# Patient Record
Sex: Female | Born: 1955 | Race: White | Hispanic: No | Marital: Married | Smoking: Never smoker
Health system: Southern US, Community
[De-identification: ages and names within clinical notes are randomized; demographics above are authoritative.]

## PROBLEM LIST (undated history)

## (undated) DIAGNOSIS — R112 Nausea with vomiting, unspecified: Secondary | ICD-10-CM

## (undated) DIAGNOSIS — H3530 Unspecified macular degeneration: Secondary | ICD-10-CM

## (undated) DIAGNOSIS — T7840XA Allergy, unspecified, initial encounter: Secondary | ICD-10-CM

## (undated) DIAGNOSIS — D649 Anemia, unspecified: Secondary | ICD-10-CM

## (undated) DIAGNOSIS — K5790 Diverticulosis of intestine, part unspecified, without perforation or abscess without bleeding: Secondary | ICD-10-CM

## (undated) DIAGNOSIS — B372 Candidiasis of skin and nail: Secondary | ICD-10-CM

## (undated) DIAGNOSIS — K5900 Constipation, unspecified: Secondary | ICD-10-CM

## (undated) DIAGNOSIS — M542 Cervicalgia: Secondary | ICD-10-CM

## (undated) DIAGNOSIS — L719 Rosacea, unspecified: Secondary | ICD-10-CM

## (undated) DIAGNOSIS — IMO0001 Reserved for inherently not codable concepts without codable children: Secondary | ICD-10-CM

## (undated) DIAGNOSIS — M1990 Unspecified osteoarthritis, unspecified site: Secondary | ICD-10-CM

## (undated) DIAGNOSIS — M26609 Unspecified temporomandibular joint disorder, unspecified side: Secondary | ICD-10-CM

## (undated) DIAGNOSIS — Z86010 Personal history of colonic polyps: Secondary | ICD-10-CM

## (undated) DIAGNOSIS — Z9889 Other specified postprocedural states: Secondary | ICD-10-CM

## (undated) DIAGNOSIS — F419 Anxiety disorder, unspecified: Secondary | ICD-10-CM

## (undated) DIAGNOSIS — M8580 Other specified disorders of bone density and structure, unspecified site: Secondary | ICD-10-CM

## (undated) DIAGNOSIS — R51 Headache: Secondary | ICD-10-CM

## (undated) DIAGNOSIS — K219 Gastro-esophageal reflux disease without esophagitis: Secondary | ICD-10-CM

## (undated) DIAGNOSIS — J321 Chronic frontal sinusitis: Secondary | ICD-10-CM

## (undated) DIAGNOSIS — G8929 Other chronic pain: Secondary | ICD-10-CM

## (undated) DIAGNOSIS — K921 Melena: Secondary | ICD-10-CM

## (undated) HISTORY — DX: Other specified disorders of bone density and structure, unspecified site: M85.80

## (undated) HISTORY — DX: Constipation, unspecified: K59.00

## (undated) HISTORY — DX: Headache: R51

## (undated) HISTORY — PX: POLYPECTOMY: SHX149

## (undated) HISTORY — PX: TONSILLECTOMY: SUR1361

## (undated) HISTORY — DX: Anemia, unspecified: D64.9

## (undated) HISTORY — DX: Unspecified osteoarthritis, unspecified site: M19.90

## (undated) HISTORY — DX: Candidiasis of skin and nail: B37.2

## (undated) HISTORY — PX: ABDOMINAL HYSTERECTOMY: SHX81

## (undated) HISTORY — DX: Unspecified macular degeneration: H35.30

## (undated) HISTORY — PX: COLONOSCOPY: SHX174

## (undated) HISTORY — DX: Rosacea, unspecified: L71.9

## (undated) HISTORY — DX: Other chronic pain: G89.29

## (undated) HISTORY — DX: Personal history of colonic polyps: Z86.010

## (undated) HISTORY — DX: Reserved for inherently not codable concepts without codable children: IMO0001

## (undated) HISTORY — PX: APPENDECTOMY: SHX54

## (undated) HISTORY — DX: Unspecified temporomandibular joint disorder, unspecified side: M26.609

## (undated) HISTORY — DX: Melena: K92.1

## (undated) HISTORY — DX: Gastro-esophageal reflux disease without esophagitis: K21.9

## (undated) HISTORY — DX: Allergy, unspecified, initial encounter: T78.40XA

## (undated) HISTORY — DX: Anxiety disorder, unspecified: F41.9

## (undated) HISTORY — DX: Chronic frontal sinusitis: J32.1

## (undated) HISTORY — DX: Cervicalgia: M54.2

---

## 1998-03-07 ENCOUNTER — Encounter: Payer: Self-pay | Admitting: Gastroenterology

## 1998-05-27 ENCOUNTER — Other Ambulatory Visit: Admission: RE | Admit: 1998-05-27 | Discharge: 1998-05-27 | Payer: Self-pay | Admitting: Internal Medicine

## 1999-06-21 ENCOUNTER — Encounter: Payer: Self-pay | Admitting: Internal Medicine

## 1999-06-21 ENCOUNTER — Encounter: Admission: RE | Admit: 1999-06-21 | Discharge: 1999-06-21 | Payer: Self-pay | Admitting: Internal Medicine

## 2000-02-06 ENCOUNTER — Encounter: Payer: Self-pay | Admitting: Internal Medicine

## 2000-02-06 ENCOUNTER — Encounter: Admission: RE | Admit: 2000-02-06 | Discharge: 2000-02-06 | Payer: Self-pay | Admitting: Internal Medicine

## 2001-02-10 ENCOUNTER — Encounter: Payer: Self-pay | Admitting: Internal Medicine

## 2001-02-10 ENCOUNTER — Encounter: Admission: RE | Admit: 2001-02-10 | Discharge: 2001-02-10 | Payer: Self-pay | Admitting: Internal Medicine

## 2002-02-12 ENCOUNTER — Encounter: Payer: Self-pay | Admitting: Internal Medicine

## 2002-02-12 ENCOUNTER — Encounter: Admission: RE | Admit: 2002-02-12 | Discharge: 2002-02-12 | Payer: Self-pay | Admitting: Internal Medicine

## 2002-03-25 ENCOUNTER — Encounter: Payer: Self-pay | Admitting: Internal Medicine

## 2002-03-25 ENCOUNTER — Encounter: Admission: RE | Admit: 2002-03-25 | Discharge: 2002-03-25 | Payer: Self-pay | Admitting: Internal Medicine

## 2002-12-04 ENCOUNTER — Encounter: Admission: RE | Admit: 2002-12-04 | Discharge: 2002-12-04 | Payer: Self-pay | Admitting: Internal Medicine

## 2003-02-11 ENCOUNTER — Encounter: Admission: RE | Admit: 2003-02-11 | Discharge: 2003-02-11 | Payer: Self-pay | Admitting: Internal Medicine

## 2004-02-01 ENCOUNTER — Encounter: Admission: RE | Admit: 2004-02-01 | Discharge: 2004-02-01 | Payer: Self-pay | Admitting: Internal Medicine

## 2005-01-08 ENCOUNTER — Ambulatory Visit: Payer: Self-pay | Admitting: Internal Medicine

## 2005-02-13 ENCOUNTER — Ambulatory Visit: Payer: Self-pay | Admitting: Internal Medicine

## 2005-02-22 ENCOUNTER — Encounter: Admission: RE | Admit: 2005-02-22 | Discharge: 2005-02-22 | Payer: Self-pay | Admitting: Internal Medicine

## 2005-10-24 ENCOUNTER — Ambulatory Visit: Payer: Self-pay | Admitting: Gastroenterology

## 2005-10-25 ENCOUNTER — Encounter: Payer: Self-pay | Admitting: Internal Medicine

## 2005-10-25 ENCOUNTER — Ambulatory Visit: Payer: Self-pay | Admitting: Gastroenterology

## 2005-10-25 ENCOUNTER — Encounter (INDEPENDENT_AMBULATORY_CARE_PROVIDER_SITE_OTHER): Payer: Self-pay | Admitting: Specialist

## 2006-03-05 ENCOUNTER — Encounter: Admission: RE | Admit: 2006-03-05 | Discharge: 2006-03-05 | Payer: Self-pay | Admitting: *Deleted

## 2006-03-19 ENCOUNTER — Encounter: Admission: RE | Admit: 2006-03-19 | Discharge: 2006-03-19 | Payer: Self-pay | Admitting: *Deleted

## 2007-01-06 ENCOUNTER — Ambulatory Visit: Payer: Self-pay | Admitting: Internal Medicine

## 2007-01-06 LAB — CONVERTED CEMR LAB
ALT: 19 units/L (ref 0–35)
AST: 22 units/L (ref 0–37)
Albumin: 3.9 g/dL (ref 3.5–5.2)
Alkaline Phosphatase: 53 units/L (ref 39–117)
BUN: 8 mg/dL (ref 6–23)
Basophils Absolute: 0 10*3/uL (ref 0.0–0.1)
Basophils Relative: 0.1 % (ref 0.0–1.0)
Bilirubin Urine: NEGATIVE
Bilirubin, Direct: 0.1 mg/dL (ref 0.0–0.3)
Blood in Urine, dipstick: NEGATIVE
CO2: 29 meq/L (ref 19–32)
Calcium: 9.2 mg/dL (ref 8.4–10.5)
Chloride: 105 meq/L (ref 96–112)
Cholesterol: 199 mg/dL (ref 0–200)
Creatinine, Ser: 0.7 mg/dL (ref 0.4–1.2)
Eosinophils Absolute: 0.1 10*3/uL (ref 0.0–0.6)
Eosinophils Relative: 1.9 % (ref 0.0–5.0)
GFR calc Af Amer: 113 mL/min
GFR calc non Af Amer: 93 mL/min
Glucose, Bld: 85 mg/dL (ref 70–99)
Glucose, Urine, Semiquant: NEGATIVE
HCT: 40.8 % (ref 36.0–46.0)
HDL: 72.7 mg/dL (ref >39.0)
Hemoglobin: 14 g/dL (ref 12.0–15.0)
Ketones, urine, test strip: NEGATIVE
LDL Cholesterol: 117 mg/dL — ABNORMAL HIGH (ref 0–99)
Lymphocytes Relative: 38.8 % (ref 12.0–46.0)
MCHC: 34.3 g/dL (ref 30.0–36.0)
MCV: 95.5 fL (ref 78.0–100.0)
Monocytes Absolute: 0.4 10*3/uL (ref 0.2–0.7)
Monocytes Relative: 11 % (ref 3.0–11.0)
Neutro Abs: 1.5 10*3/uL (ref 1.4–7.7)
Neutrophils Relative %: 48.2 % (ref 43.0–77.0)
Nitrite: NEGATIVE
Platelets: 183 10*3/uL (ref 150–400)
Potassium: 4.3 meq/L (ref 3.5–5.1)
Protein, U semiquant: NEGATIVE
RBC: 4.27 M/uL (ref 3.87–5.11)
RDW: 11.8 % (ref 11.5–14.6)
Sodium: 140 meq/L (ref 135–145)
Specific Gravity, Urine: 1.02
TSH: 1.08 microintl units/mL (ref 0.35–5.50)
Total Bilirubin: 0.6 mg/dL (ref 0.3–1.2)
Total CHOL/HDL Ratio: 2.7
Total Protein: 6.6 g/dL (ref 6.0–8.3)
Triglycerides: 48 mg/dL (ref 0–149)
Urobilinogen, UA: 0.2
VLDL: 10 mg/dL (ref 0–40)
WBC Urine, dipstick: NEGATIVE
WBC: 3.2 10*3/uL — ABNORMAL LOW (ref 4.5–10.5)
pH: 5.5

## 2007-01-28 ENCOUNTER — Ambulatory Visit: Payer: Self-pay | Admitting: Internal Medicine

## 2007-01-28 DIAGNOSIS — B372 Candidiasis of skin and nail: Secondary | ICD-10-CM

## 2007-01-28 DIAGNOSIS — J321 Chronic frontal sinusitis: Secondary | ICD-10-CM

## 2007-01-28 HISTORY — DX: Candidiasis of skin and nail: B37.2

## 2007-01-28 HISTORY — DX: Chronic frontal sinusitis: J32.1

## 2007-01-31 ENCOUNTER — Ambulatory Visit: Payer: Self-pay | Admitting: Cardiology

## 2007-02-07 ENCOUNTER — Telehealth: Payer: Self-pay | Admitting: *Deleted

## 2007-02-27 ENCOUNTER — Telehealth (INDEPENDENT_AMBULATORY_CARE_PROVIDER_SITE_OTHER): Payer: Self-pay | Admitting: *Deleted

## 2007-03-07 ENCOUNTER — Encounter: Admission: RE | Admit: 2007-03-07 | Discharge: 2007-03-07 | Payer: Self-pay | Admitting: Obstetrics and Gynecology

## 2008-03-09 ENCOUNTER — Encounter: Admission: RE | Admit: 2008-03-09 | Discharge: 2008-03-09 | Payer: Self-pay | Admitting: Obstetrics and Gynecology

## 2008-08-30 DIAGNOSIS — Z860100 Personal history of colon polyps, unspecified: Secondary | ICD-10-CM

## 2008-08-30 HISTORY — DX: Personal history of colonic polyps: Z86.010

## 2008-08-30 HISTORY — DX: Personal history of colon polyps, unspecified: Z86.0100

## 2008-09-02 ENCOUNTER — Ambulatory Visit: Payer: Self-pay | Admitting: Gastroenterology

## 2008-09-02 DIAGNOSIS — K921 Melena: Secondary | ICD-10-CM

## 2008-09-02 DIAGNOSIS — K219 Gastro-esophageal reflux disease without esophagitis: Secondary | ICD-10-CM

## 2008-09-02 HISTORY — DX: Melena: K92.1

## 2008-09-02 HISTORY — DX: Gastro-esophageal reflux disease without esophagitis: K21.9

## 2008-11-20 LAB — CONVERTED CEMR LAB: Pap Smear: NORMAL

## 2008-11-23 ENCOUNTER — Ambulatory Visit: Payer: Self-pay | Admitting: Internal Medicine

## 2008-11-23 LAB — CONVERTED CEMR LAB
ALT: 22 units/L (ref 0–35)
AST: 23 units/L (ref 0–37)
Albumin: 4.2 g/dL (ref 3.5–5.2)
Alkaline Phosphatase: 56 units/L (ref 39–117)
BUN: 11 mg/dL (ref 6–23)
Basophils Absolute: 0 10*3/uL (ref 0.0–0.1)
Basophils Relative: 0.7 % (ref 0.0–3.0)
Bilirubin Urine: NEGATIVE
Bilirubin, Direct: 0.1 mg/dL (ref 0.0–0.3)
Blood in Urine, dipstick: NEGATIVE
CO2: 30 meq/L (ref 19–32)
Calcium: 9 mg/dL (ref 8.4–10.5)
Chloride: 104 meq/L (ref 96–112)
Cholesterol: 222 mg/dL — ABNORMAL HIGH (ref 0–200)
Creatinine, Ser: 0.8 mg/dL (ref 0.4–1.2)
Direct LDL: 137.8 mg/dL
Eosinophils Absolute: 0.2 10*3/uL (ref 0.0–0.7)
Eosinophils Relative: 6.2 % — ABNORMAL HIGH (ref 0.0–5.0)
GFR calc non Af Amer: 79.09 mL/min (ref >60)
Glucose, Bld: 84 mg/dL (ref 70–99)
Glucose, Urine, Semiquant: NEGATIVE
HCT: 40.2 % (ref 36.0–46.0)
HDL: 72.1 mg/dL (ref >39.00)
Hemoglobin: 13.9 g/dL (ref 12.0–15.0)
Ketones, urine, test strip: NEGATIVE
Lymphocytes Relative: 34.1 % (ref 12.0–46.0)
Lymphs Abs: 1.3 10*3/uL (ref 0.7–4.0)
MCHC: 34.7 g/dL (ref 30.0–36.0)
MCV: 98 fL (ref 78.0–100.0)
Monocytes Absolute: 0.3 10*3/uL (ref 0.1–1.0)
Monocytes Relative: 9.2 % (ref 3.0–12.0)
Neutro Abs: 2 10*3/uL (ref 1.4–7.7)
Neutrophils Relative %: 49.8 % (ref 43.0–77.0)
Nitrite: NEGATIVE
Platelets: 170 10*3/uL (ref 150.0–400.0)
Potassium: 5 meq/L (ref 3.5–5.1)
Protein, U semiquant: NEGATIVE
RBC: 4.1 M/uL (ref 3.87–5.11)
RDW: 12.1 % (ref 11.5–14.6)
Sodium: 140 meq/L (ref 135–145)
Specific Gravity, Urine: 1.02
TSH: 1.15 microintl units/mL (ref 0.35–5.50)
Total Bilirubin: 0.7 mg/dL (ref 0.3–1.2)
Total CHOL/HDL Ratio: 3
Total Protein: 6.8 g/dL (ref 6.0–8.3)
Triglycerides: 54 mg/dL (ref 0.0–149.0)
Urobilinogen, UA: 0.2
VLDL: 10.8 mg/dL (ref 0.0–40.0)
WBC Urine, dipstick: NEGATIVE
WBC: 3.8 10*3/uL — ABNORMAL LOW (ref 4.5–10.5)
pH: 6.5

## 2008-11-30 ENCOUNTER — Ambulatory Visit: Payer: Self-pay | Admitting: Internal Medicine

## 2009-03-10 ENCOUNTER — Encounter: Admission: RE | Admit: 2009-03-10 | Discharge: 2009-03-10 | Payer: Self-pay | Admitting: Obstetrics and Gynecology

## 2010-02-11 ENCOUNTER — Other Ambulatory Visit: Payer: Self-pay | Admitting: Obstetrics and Gynecology

## 2010-02-11 ENCOUNTER — Encounter: Payer: Self-pay | Admitting: Internal Medicine

## 2010-02-11 DIAGNOSIS — M8580 Other specified disorders of bone density and structure, unspecified site: Secondary | ICD-10-CM

## 2010-02-11 DIAGNOSIS — Z1239 Encounter for other screening for malignant neoplasm of breast: Secondary | ICD-10-CM

## 2010-02-21 NOTE — Progress Notes (Signed)
Summary: information on Cat Scan    Phone Note Call from Patient Call back at Home Phone [phone]   Caller: patient triage message Call For: Lovell Sheehan Summary of Call: cell [phone].  Try home # first.  Had cat scan done in January.  Needs exact name of place on [facility] where she had it done.  She also wants the results sent to her opthalomogist.   Initial call taken by: Roselle Locus,  February 27, 2007 12:36 PM  Follow-up for Phone Call        Called pt back, helped her with phone numbers to [facility].  She ne longer needs copy of CT scan of sinusus.  She has an appt with her Opthalmologist for ongoing pain on left side of head. Follow-up by: Sid Falcon LPN,  February 27, 2007 2:22 PM

## 2010-02-21 NOTE — Procedures (Signed)
Summary: GI Endo Procedure-COLON  GI Endo Procedure-COLON   Imported By: Lowry Ram NCMA 08/31/2008 13:46:49  _____________________________________________________________________  External Attachment:    Type:   Image     Comment:   External Document

## 2010-02-21 NOTE — Assessment & Plan Note (Signed)
Summary: cpx/no paP/N[facility] rsc per pt/njr  Medications Added PREMARIN 0.3 MG  TABS (ESTROGENS CONJUGATED) one by mouth daily CLARITIN REDITABS 10 MG  TBDP (LORATADINE) prn VITAMIN D3 1000 UNIT  TABS (CHOLECALCIFEROL) one by mouth daily in addition to what is in your calcium suppliemnt FISH OIL CONCENTRATE 1000 MG  CAPS (OMEGA-3 FATTY ACIDS) two capsules by mouth daily PENLAC 8 %  SOLN (CICLOPIROX) apply to nail as directed        Vital Signs:  Patient Profile:   55 Years Old Female Height:     64.5 inches Weight:      130 pounds Temp:     98.2 degrees F oral Pulse rate:   82 / minute Pulse (ortho):   82 / minute Pulse rhythm:   regular Resp:     12 per minute BP sitting:   110 / 72  Vitals Entered By: Lynann Beaver CMA (January 28, 2007 3:10 PM)                 Chief Complaint:  cpx.  History of Present Illness: CPX seeing eye doctor for gaucomal and still has headaches ( left temporal head aches ( had a CT and every thing was OK) worried about sinus CT     Past Medical History:    Reviewed history and no changes required:       glaucoma...  Past Surgical History:    Reviewed history and no changes required:       Hysterectomy       Tonsillectomy   Family History:    Reviewed history and no changes required:       father   CVA at advanbed age and barrets esophagus       mother       Family History Breast cancer 1st degree relative <50       [place]  Social History:    Reviewed history and no changes required:       Married       Never Smoked       Alcohol use-no       Drug use-no       Regular exercise-yes   Risk Factors:  Tobacco use:  never Passive smoke exposure:  no Drug use:  no HIV high-risk behavior:  no Alcohol use:  no Exercise:  yes  Family History Risk Factors:    Family History of MI in females < 25 years old:  no    Family History of MI in males < 42 years old:  no   Review of Systems  The patient denies anorexia, fever,  weight loss, vision loss, decreased hearing, hoarseness, chest pain, syncope, dyspnea on exhertion, peripheral edema, prolonged cough, hemoptysis, abdominal pain, melena, hematochezia, severe indigestion/heartburn, hematuria, incontinence, genital sores, muscle weakness, suspicious skin lesions, transient blindness, difficulty walking, depression, unusual weight change, abnormal bleeding, enlarged lymph nodes, angioedema, and breast masses.       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 One by mouth daily 2)  Claritin Reditabs 10 Mg Tbdp (Loratadine) .... Prn 3)  Vitamin D3 1000  Unit Tabs (Cholecalciferol) .... One by mouth daily in addition to what is in your calcium suppliemnt 4)  Fish Oil Concentrate 1000 Mg Caps (Omega-3 fatty acids) .... Two capsules by mouth daily 5)  Penlac 8 % Soln (Ciclopirox) .... Apply to nail as directed   Patient Instructions: 1)  Please schedule a follow-up appointment in 1 year.  CPX    Prescriptions: PENLAC 8 %  SOLN (CICLOPIROX) apply to nail as directed  #1 vial x 3   Entered and Authorized by:   Stacie Glaze MD   Signed by:   Stacie Glaze MD on 01/28/2007   Method used:   Print then Give to Patient   RxID:   [id number]  ][Preventive Care Screening-CCC]

## 2010-02-21 NOTE — Assessment & Plan Note (Signed)
Summary: cpx/no pap/njr   Vital Signs:  Patient profile:   55 year old female Height:      64.5 inches Weight:      130 pounds BMI:     22.05 Temp:     98.2 degrees F oral Pulse rate:   64 / minute Resp:     14 per minute BP sitting:   106 / 70  (left arm)  Vitals Entered By: Willy Eddy, LPN (November 30, 2008 2:34 PM) CC: CPX   CC:  CPX.  History of Present Illness: The pt was asked about all immunizations, health maint. services that are appropriate to their age and was given guidance on diet exercize  and weight management   Preventive Screening-Counseling & Management  Alcohol-Tobacco     Smoking Status: never  Contraindications/Deferment of Procedures/Staging:    Test/Procedure: DPT vaccine    Reason for deferment: declined   Problems Prior to Update: 1)  Hematochezia  (ICD-578.1) 2)  Gerd  (ICD-530.81) 3)  Colonic Polyps, Adenomatous, Hx of  (ICD-V12.72) 4)  Moniliasis, Skin/nails  (ICD-112.3) 5)  Chronic Frontal Sinusitis  (ICD-473.1) 6)  Family History Breast Cancer 1st Degree Relative <50  (ICD-V16.3) 7)  Physical Examination  (ICD-V70.0)  Medications Prior to Update: 1)  Premarin 0.3 Mg  Tabs (Estrogens Conjugated) .... One By Mouth Daily 2)  Claritin Reditabs 10 Mg  Tbdp (Loratadine) .... Prn 3)  Vitamin D3 1000 Unit  Tabs (Cholecalciferol) .... One By Mouth Daily in Addition To What Is in Your Calcium Suppliemnt 4)  Penlac 8 %  Soln (Ciclopirox) .... Apply To Nail As Directed 5)  Nexium 40 Mg  Cpdr (Esomeprazole Magnesium) .Marland Kitchen.. 1 Capsule Each Day 30 Minutes Before Breakfast 6)  Xalatan 0.005 % Soln (Latanoprost) .... One Drop in Each Eye Once Daily 7)  Dulcolax 5 Mg Tbec (Bisacodyl) .... One Tablet Every Other Saturday As Needed  Current Medications (verified): 1)  Vivelle-Dot 0.0375 Mg/24hr Pttw (Estradiol) .... Apply As Directed 2)  Claritin Reditabs 10 Mg  Tbdp (Loratadine) .... Prn 3)  Vitamin D3 1000 Unit  Tabs (Cholecalciferol) .... One  By Mouth Daily in Addition To What Is in Your Calcium Suppliemnt 4)  Penlac 8 %  Soln (Ciclopirox) .... Apply To Nail As Directed 5)  Nexium 40 Mg  Cpdr (Esomeprazole Magnesium) .Marland Kitchen.. 1 Capsule Each Day 30 Minutes Before Breakfast 6)  Xalatan 0.005 % Soln (Latanoprost) .... One Drop in Each Eye Once Daily 7)  Dulcolax 5 Mg Tbec (Bisacodyl) .... One Tablet Every Other Saturday As Needed  Allergies (verified): No Known Drug Allergies  Past History:  Family History: Last updated: 09/02/2008 father   CVA at advanbed age and barretts esophagus Family History Breast cancer 1st degree relative <50-Mother Family History of Colon Cancer: Great Grandmother Family History of Diabetes: Grandfather Family History of Heart Disease: Grandmother Family History of Colon Polyps: Father  Social History: Last updated: 09/02/2008 Married, 2 boys (adopted from [place]. [place] Drug use-no Regular exercise-yes Alcohol Use - yes-rare-only 1-2 times per year Patient has never smoked.  Daily Caffeine Useat least 2 cups coffee/day  Risk Factors: Exercise: yes (01/28/2007)  Risk Factors: Smoking Status: never (11/30/2008) Passive Smoke Exposure: no (01/28/2007)  Past medical, surgical, family and social histories (including risk factors) reviewed, and no changes noted (except as noted below).  Past Medical History: Reviewed history from 09/02/2008 and no changes required. glaucoma GERD adenomatous colon polyps 10/2005 diverticulosis Degenerative joint disease  anxiety headaches rosacea  Past Surgical History: Reviewed history from 09/02/2008 and no changes required. Hysterectomy with BSO Tonsillectomy  Family History: Reviewed history from 09/02/2008 and no changes required. father   CVA at advanbed age and barretts esophagus Family History Breast cancer 1st degree relative <50-Mother Family History of Colon Cancer: Great Grandmother Family History of Diabetes: Grandfather Family History of  Heart Disease: Grandmother Family History of Colon Polyps: Father  Social History: Reviewed history from 09/02/2008 and no changes required. Married, 2 boys (adopted from [place]. [place]) Drug use-no Regular exercise-yes Alcohol Use - yes-rare-only 1-2 times per year Patient has never smoked.  Daily Caffeine Useat least 2 cups coffee/day  Review of Systems  The patient denies anorexia, fever, weight loss, weight gain, vision loss, decreased hearing, hoarseness, chest pain, syncope, dyspnea on exertion, peripheral edema, prolonged cough, headaches, hemoptysis, abdominal pain, melena, hematochezia, severe indigestion/heartburn, hematuria, incontinence, genital sores, muscle weakness, suspicious skin lesions, transient blindness, difficulty walking, depression, unusual weight change, abnormal bleeding, enlarged lymph nodes, angioedema, and breast masses.    Physical Exam  General:  Well developed, well nourished, no acute distress. Head:  Normocephalic and atraumatic. Eyes:  PERRLA, no icterus. Mouth:  No deformity or lesions, dentition normal. Lungs:  Clear throughout to auscultation. Heart:  Regular rate and rhythm; no murmurs, rubs,  or bruits.   Impression & Recommendations:  Problem # 1:  PHYSICAL EXAMINATION (ICD-V70.0)  Mammogram: normal (03/21/2008) Pap smear: normal (11/20/2008) Colonoscopy: Location:  [facility].   (10/25/2005) Flu Vax: Historical (11/25/2008)   Chol: 222 (11/23/2008)   HDL: 72.10 (11/23/2008)   LDL: 117 (01/06/2007)   TG: 54.0 (11/23/2008) TSH: 1.15 (11/23/2008)   Next mammogram due:: 11/2009 (11/30/2008) Next Colonoscopy due:: 10/2010 (10/25/2005)  Discussed using sunscreen, use of alcohol, drug use, self breast exam, routine dental care, routine eye care, schedule for GYN exam, routine physical exam, seat belts, multiple vitamins, osteoporosis prevention, adequate calcium intake in diet, recommendations for immunizations, mammograms and Pap  smears.  Discussed exercise and checking cholesterol.  Discussed gun safety, safe sex, and contraception.  Complete Medication List: 1)  Vivelle-dot 0.0375 Mg/24hr Pttw (Estradiol) .... Apply as directed 2)  Claritin Reditabs 10 Mg Tbdp (Loratadine) .... Prn 3)  Vitamin D3 1000 Unit Tabs (Cholecalciferol) .... One by mouth daily in addition to what is in your calcium suppliemnt 4)  Penlac 8 % Soln (Ciclopirox) .... Apply to nail as directed 5)  Nexium 40 Mg Cpdr (Esomeprazole magnesium) .Marland Kitchen.. 1 capsule each day 30 minutes before breakfast 6)  Xalatan 0.005 % Soln (Latanoprost) .... One drop in each eye once daily 7)  Dulcolax 5 Mg Tbec (Bisacodyl) .... One tablet every other saturday as needed  Patient Instructions: 1)  Please schedule a follow-up appointment in 1 year. Prescriptions: PENLAC 8 %  SOLN (CICLOPIROX) apply to nail as directed  #7 Milliliter x 11   Entered and Authorized by:   Stacie Glaze MD   Signed by:   Stacie Glaze MD on 11/30/2008   Method used:   Electronically to        [facility]  [phone]* (retail)       [address] [place], [place]  [address]       Ph: [phone] or [phone]       Fax: [phone]   RxID:   [id number]    Immunization History:  Influenza Immunization History:    Influenza:  historical (11/25/2008)     Preventive Care  Screening  Mammogram:    Date:  03/21/2008    Next Due:  11/2009    Results:  normal   Pap Smear:    Date:  11/20/2008    Next Due:  11/2009    Results:  normal    Appended Document: Orders Update     Clinical Lists Changes  Orders: Added new Service order of Tdap => 88yrs IM (14782) - Signed Added new Service order of Admin 1st Vaccine (95621) - Signed Observations: Added new observation of TD BOOSTERLO: [id number] (11/30/2008 15:28) Added new observation of TD BOOST EXP: 08/07/2010 (11/30/2008 15:28) Added new observation of TD BOOSTERBY: Willy Eddy, LPN (62/95/2841  15:28) Added new observation of TD BOOSTERRT: IM (11/30/2008 15:28) Added new observation of TDBOOSTERDSE: 0.5 ml (11/30/2008 15:28) Added new observation of TD BOOSTERMF: GlaxoSmithKline (11/30/2008 15:28) Added new observation of TD BOOST SIT: left deltoid (11/30/2008 15:28) Added new observation of TD BOOSTER: Tdap (11/30/2008 15:28)       Immunizations Administered:  Tetanus Vaccine:    Vaccine Type: Tdap    Site: left deltoid    Mfr: GlaxoSmithKline    Dose: 0.5 ml    Route: IM    Given by: Willy Eddy, LPN    Exp. Date: 08/07/2010    Lot #: [id number]

## 2010-02-21 NOTE — Assessment & Plan Note (Signed)
Summary: STOMACH PAINS AND REFLUX/FH   History of Present Illness Visit Type: Follow-up Visit Primary GI MD: Elie Goody MD [facility] Chief Complaint: Reflux that Pepcid Complete does not control, also used prilosec that gave her relief while taking.  Took mother's Nexium while on vacation and felt better History of Present Illness:   Erica Kramer relates worsening reflux symtpoms in June for several days with nighttime symptoms and burning chest pain. Resolved with Prilosec OTC and then Nexium for a few days. Symptoms now mild and intermittent controlled with Pepcid complete as needed.  She also noted rare episodes of small volume hematochezia. Colonoscopy in 2007 was normal.   GI Review of Systems    Reports acid reflux and  heartburn.      Denies abdominal pain, belching, bloating, chest pain, dysphagia with liquids, dysphagia with solids, loss of appetite, nausea, vomiting, vomiting blood, weight loss, and  weight gain.      Reports hemorrhoids and  rectal bleeding.     Denies anal fissure, black tarry stools, change in bowel habit, constipation, diarrhea, diverticulosis, fecal incontinence, heme positive stool, irritable bowel syndrome, jaundice, light color stool, liver problems, and  rectal pain.   Current Medications (verified): 1)  Premarin 0.3 Mg  Tabs (Estrogens Conjugated) .... One By Mouth Daily 2)  Claritin Reditabs 10 Mg  Tbdp (Loratadine) .... Prn 3)  Vitamin D3 1000 Unit  Tabs (Cholecalciferol) .... One By Mouth Daily in Addition To What Is in Your Calcium Suppliemnt 4)  Penlac 8 %  Soln (Ciclopirox) .... Apply To Nail As Directed 5)  Pepcid Complete 10-800-165 Mg Chew (Famotidine-Ca Carb-Mag Hydrox) .... Take 1 Tablet 1-2 Times Daily 6)  Xalatan 0.005 % Soln (Latanoprost) .... One Drop in Each Eye Once Daily 7)  Dulcolax 5 Mg Tbec (Bisacodyl) .... One Tablet Every Other Saturday As Needed  Allergies (verified): No Known Drug Allergies  Past History:  Past Medical  History: glaucoma GERD adenomatous colon polyps 10/2005 diverticulosis Degenerative joint disease  anxiety headaches rosacea  Past Surgical History: Hysterectomy with BSO Tonsillectomy  Family History: father   CVA at advanbed age and barretts esophagus Family History Breast cancer 1st degree relative <50-Mother Family History of Colon Cancer: Great Grandmother Family History of Diabetes: Grandfather Family History of Heart Disease: Grandmother Family History of Colon Polyps: Father  Social History: Married, 2 boys (adopted from [place]. [place]) Drug use-no Regular exercise-yes Alcohol Use - yes-rare-only 1-2 times per year Patient has never smoked.  Daily Caffeine Useat least 2 cups coffee/day  Review of Systems       The pertinent positives and negatives are noted as above and in the HPI. All other ROS were reviewed and were negative.   Vital Signs:  Patient profile:   55 year old female Height:      64.5 inches Weight:      132 pounds BMI:     22.39 Pulse rate:   64 / minute Pulse rhythm:   regular BP sitting:   86 / 60  (left arm) Cuff size:   regular  Vitals Entered By: Francee Piccolo CMA Duncan Dull) (September 02, 2008 9:17 AM)  Physical Exam  General:  Well developed, well nourished, no acute distress. Head:  Normocephalic and atraumatic. Eyes:  PERRLA, no icterus. Mouth:  No deformity or lesions, dentition normal. Lungs:  Clear throughout to auscultation. Heart:  Regular rate and rhythm; no murmurs, rubs,  or bruits. Abdomen:  Soft, nontender and nondistended. No masses, hepatosplenomegaly or hernias  noted. Normal bowel sounds. Rectal:  small external hemorrhoidal tags. hemocult negative.   Psych:  Alert and cooperative. Normal mood and affect.   Impression & Recommendations:  Problem # 1:  GERD (ICD-530.81) Assessment Deteriorated GERD for several years which is generally controlled with diet and OTC medications. Recent flare in June. May use Nexium 40  mg q.a.m. for flares. If she has frequently recurrent symptoms that do not respond to Nexium or her symptoms persist for the next 1 to 2 years she should undergo endoscopy for further evaluation.   Problem # 2:  HEMATOCHEZIA (ICD-578.1) Intermittent small-volume hematochezia, likely secondary to small external hemorrhoids. May use over-the-counter hydrocortisone-containing cream twice a day prn. Standard rectal care instructions.  Problem # 3:  COLONIC POLYPS, ADENOMATOUS, HX OF (ICD-V12.72) Surveillance colonoscopy October 2012.  Patient Instructions: 1)  Pick up your Nexium at the pharmacy. 2)  Please follow antireflux measures given to you at your office visit. 3)  Please look over the hemorrhoids handout given to you. 4)  The medication list was reviewed and reconciled.  All changed / newly prescribed medications were explained.  A complete medication list was provided to the patient / caregiver.  Prescriptions: NEXIUM 40 MG  CPDR (ESOMEPRAZOLE MAGNESIUM) 1 capsule each day 30 minutes before breakfast  #30 x 11   Entered by:   Hortense Ramal CMA (AAMA)   Authorized by:   Meryl Dare MD [facility]   Signed by:   Hortense Ramal CMA (AAMA) on 09/02/2008   Method used:   [facility]        [facility]  [phone]* (retail)       [address] [place], [place]  [address]       Ph: [phone] or [phone]       Fax: [phone]   RxID:   [id number]

## 2010-02-21 NOTE — Procedures (Signed)
Summary: COLON   Colonoscopy  Procedure date:  10/25/2005  Findings:      Location:  [facility].    Procedures Next Due Date:    Colonoscopy: 10/2010 Patient Name: Erica, Kramer MRN:  Procedure Procedures: Colonoscopy CPT: 38756.    with polypectomy. CPT: [id number].  Personnel: Endoscopist: Venita Lick. Russella Dar, MD, Clementeen Graham.  Exam Location: Exam performed in Outpatient Clinic. Outpatient  Patient Consent: Procedure, Alternatives, Risks and Benefits discussed, consent obtained, from patient. Consent was obtained by the RN.  Indications Symptoms: Constipation Hematochezia. Change in bowel habits.  History  Current Medications: Patient is not currently taking Coumadin.  Pre-Exam Physical: Performed Oct 25, 2005. Cardio-pulmonary exam, Rectal exam, HEENT exam , Abdominal exam, Mental status exam WNL.  Comments: Pt. history reviewed/updated, physical exam performed prior to initiation of sedation?Yes Exam Exam: Extent of exam reached: Cecum, extent intended: Cecum.  The cecum was identified by appendiceal orifice and IC valve. Time to Cecum: 00:05: 07. Time for Withdrawl: 00:12:28. Colon retroflexion performed. ASA Classification: II. Tolerance: excellent.  Monitoring: Pulse and BP monitoring, Oximetry used. Supplemental O2 given.  Colon Prep Used MoviPrep for colon prep. Prep results: good.  Sedation Meds: Patient assessed and found to be appropriate for moderate (conscious) sedation. Fentanyl 100 mcg. given IV. Versed 10 mg. given IV. Benadryl 25mg  given IV.  Findings NORMAL EXAM: Cecum to Descending Colon.  MULTIPLE POLYPS: Sigmoid Colon. minimum size 3 mm, maximum size 4 mm. Procedure:  snare without cautery, removed, Polyp retrieved, 2 polyps Polyps sent to pathology. ICD9: Colon Polyps: 211.3.  NORMAL EXAM: Rectum.   Assessment  Diagnoses: 211.3: Colon Polyps.   Events  Unplanned Interventions: No intervention was required.  Unplanned  Events: There were no complications. Plans  Post Exam Instructions: Post sedation instructions given. No aspirin or non-steroidal containing medications: 2 weeks.  Medication Plan: Await pathology. Anti-constipation: Glycolax QD prn,   Patient Education: Patient given standard instructions for: Polyps. Constipation. High Fiber diet.  Disposition: After procedure patient sent to recovery. After recovery patient sent home.  Scheduling/Referral: Colonoscopy, to [facility] T. Russella Dar, MD, [facility], if polyp(s) adenomatous, around Oct 26, 2010.  Office Visit, to [employer]. Russella Dar, MD, FACG, prn     FINAL DIAGNOSIS    ***MICROSCOPIC EXAMINATION AND DIAGNOSIS***    SIGMOID COLON, POLYP(S): ADENOMATOUS POLYP(S). NO HIGH GRADE   DYSPLASIA OR INVASIVE MALIGNANCY IDENTIFIED.    gdt   Date Reported: 10/29/2005 Erica Server A. Delila Spence, MD   *** Electronically Signed Out By EAA ***    Clinical information   Change in bowel habits. UQ abd pain. R/O adenomatous polyp (jy)    specimen(s) obtained   Colon, polyp(s), sigmoid    Gross Description   Received in formalin are two tan mucosal fragments, 0.2 and 0.3   cm. Entirely submitted along with fragments grossly consistent   with intestinal debris. One block (TB:jes, 10/26/05)    jes/  This report was created from the original endoscopy report, which was reviewed and signed by the above listed endoscopist. cc: Stacie Glaze

## 2010-03-14 ENCOUNTER — Ambulatory Visit
Admission: RE | Admit: 2010-03-14 | Discharge: 2010-03-14 | Disposition: A | Discharge status: Home / Self Care | Payer: BC Managed Care – PPO | Source: Ambulatory Visit | Attending: Obstetrics and Gynecology | Admitting: Obstetrics and Gynecology

## 2010-03-14 DIAGNOSIS — M8580 Other specified disorders of bone density and structure, unspecified site: Secondary | ICD-10-CM

## 2010-03-14 DIAGNOSIS — Z1239 Encounter for other screening for malignant neoplasm of breast: Secondary | ICD-10-CM

## 2010-06-09 NOTE — Assessment & Plan Note (Signed)
[facility]                           GASTROENTEROLOGY OFFICE NOTE   NAME:Tipler, Erica BERGEY                        MRN:          [id number]  DATE:10/24/2005                            DOB:          12/25/1955    HISTORY OF PRESENT ILLNESS:  Erica Kramer is a very nice 55 year old white  female who returns today with complaints of worsening constipation and left  lower quadrant pain. She has noted firm, hard stools and some significant  difficulties evacuating her stool. She has re-intensified her high fiber  diet and increased her fluid intake and this has improved her symptoms. She  has noted that her routine of using Dulcolax tablets on about a weekly basis  has not been as effective for the past several weeks. She did note a small  amount of blood per rectum and mucous with bowel movements. Please refer to  my note from May 04, 2003. Her last colonoscopy was performed in February  2000 and showed only mild sigmoid colon diverticulosis. There is no family  history of colon cancer, colon polyps or inflammatory bowel disease. She  notes no weight loss or change in stool caliber. Her reflux has generally  been under good control but over the past few weeks, she has noted waking  about 4:00 a.m. with some mild lower substernal and epigastric burning that  is improved with Pepcid AC. She generally takes her Prevacid at bedtime. She  has no dysphagia or odynophagia.   MEDICATIONS:  Medications listed on the maintenance medication sheet,  updated and reviewed.   ALLERGIES:  NO KNOWN DRUG ALLERGIES.   PHYSICAL EXAMINATION:  GENERAL:  No acute distress.  VITAL SIGNS:  Weight 127.8 pounds. Blood pressure 120/62, pulse 60 and  regular.  HEENT:  Anicteric sclerae. Oropharynx clear.  CHEST:  Clear to auscultation bilaterally.  CARDIAC:  Regular rate and rhythm.  Without murmurs appreciated.  ABDOMEN:  Soft, nontender, and nondistended. Normal active bowel  sounds. No  palpable organomegaly, masses, or hernias.  RECTAL:  Examination deferred at time of colonoscopy.  NEUROLOGIC:  Alert and oriented times three. Grossly non-focal examination.   ASSESSMENT/PLAN:  1. Left lower quadrant pain, small volume hematochezia, and worsening      constipation. Rule out colorectal neoplasms. Risk, benefits, and      alternatives to colonoscopy with possible biopsy and possible      polypectomy discussed with the patient. She consents to proceed. This      will be scheduled electively. She is advised to maintain a high fiber      diet and adequate fluid intake. She may add a fiber supplement on a      daily basis as needed. I have advised her to discontinue Dulcolax and      use Milk of Magnesia of MiraLax every other day p.r.n.  2. Gastroesophageal reflux disease.  Frequent nocturnal breakthrough      symptoms. I have advised her to take her Prevacid about 30 minutes      before her evening meal. She is to re-intensity all anti-reflux  measures. If this is not effective in reducing her nocturnal symptoms,      she may take a Pepcid AC q.h.s. If her symptoms are not able to be      controlled, consider upper endoscopy for further evaluation.       Venita Lick. Russella Dar, MD, [facility]      MTS/MedQ  DD:  10/24/2005  DT:  10/24/2005  Job #:  [id number]   cc:   Stacie Glaze, MD

## 2010-11-30 ENCOUNTER — Encounter: Payer: Self-pay | Admitting: Gastroenterology

## 2010-12-22 ENCOUNTER — Other Ambulatory Visit (INDEPENDENT_AMBULATORY_CARE_PROVIDER_SITE_OTHER): Payer: BC Managed Care – PPO

## 2010-12-22 DIAGNOSIS — Z0000 Encounter for general adult medical examination without abnormal findings: Secondary | ICD-10-CM

## 2010-12-22 LAB — CBC WITH DIFFERENTIAL/PLATELET
Basophils Absolute: 0 10*3/uL (ref 0.0–0.1)
Basophils Relative: 0.6 % (ref 0.0–3.0)
Eosinophils Absolute: 0.1 10*3/uL (ref 0.0–0.7)
Eosinophils Relative: 2.2 % (ref 0.0–5.0)
HCT: 41.6 % (ref 36.0–46.0)
Hemoglobin: 14.3 g/dL (ref 12.0–15.0)
Lymphocytes Relative: 34.7 % (ref 12.0–46.0)
Lymphs Abs: 1.4 10*3/uL (ref 0.7–4.0)
MCHC: 34.3 g/dL (ref 30.0–36.0)
MCV: 96.1 fl (ref 78.0–100.0)
Monocytes Absolute: 0.4 10*3/uL (ref 0.1–1.0)
Monocytes Relative: 9.7 % (ref 3.0–12.0)
Neutro Abs: 2.1 10*3/uL (ref 1.4–7.7)
Neutrophils Relative %: 52.8 % (ref 43.0–77.0)
Platelets: 185 10*3/uL (ref 150.0–400.0)
RBC: 4.33 Mil/uL (ref 3.87–5.11)
RDW: 13.3 % (ref 11.5–14.6)
WBC: 4 10*3/uL — ABNORMAL LOW (ref 4.5–10.5)

## 2010-12-22 LAB — POCT URINALYSIS DIPSTICK
Bilirubin, UA: NEGATIVE
Blood, UA: NEGATIVE
Glucose, UA: NEGATIVE
Ketones, UA: NEGATIVE
Nitrite, UA: NEGATIVE
Protein, UA: NEGATIVE
Spec Grav, UA: 1.025
Urobilinogen, UA: 0.2
pH, UA: 5

## 2010-12-22 LAB — BASIC METABOLIC PANEL
BUN: 13 mg/dL (ref 6–23)
CO2: 28 mEq/L (ref 19–32)
Calcium: 9.1 mg/dL (ref 8.4–10.5)
Chloride: 106 mEq/L (ref 96–112)
Creatinine, Ser: 0.8 mg/dL (ref 0.4–1.2)
GFR: 79.65 mL/min (ref >60.00)
Glucose, Bld: 85 mg/dL (ref 70–99)
Potassium: 4.7 mEq/L (ref 3.5–5.1)
Sodium: 140 mEq/L (ref 135–145)

## 2010-12-22 LAB — HEPATIC FUNCTION PANEL
ALT: 25 U/L (ref 0–35)
AST: 25 U/L (ref 0–37)
Albumin: 4.2 g/dL (ref 3.5–5.2)
Alkaline Phosphatase: 64 U/L (ref 39–117)
Bilirubin, Direct: 0 mg/dL (ref 0.0–0.3)
Total Bilirubin: 0.5 mg/dL (ref 0.3–1.2)
Total Protein: 7 g/dL (ref 6.0–8.3)

## 2010-12-22 LAB — LIPID PANEL
Cholesterol: 219 mg/dL — ABNORMAL HIGH (ref 0–200)
HDL: 80.7 mg/dL (ref >39.00)
Total CHOL/HDL Ratio: 3
Triglycerides: 50 mg/dL (ref 0.0–149.0)
VLDL: 10 mg/dL (ref 0.0–40.0)

## 2010-12-22 LAB — TSH: TSH: 1.37 u[IU]/mL (ref 0.35–5.50)

## 2010-12-22 LAB — LDL CHOLESTEROL, DIRECT: Direct LDL: 122.8 mg/dL

## 2010-12-29 ENCOUNTER — Encounter: Payer: Self-pay | Admitting: Internal Medicine

## 2010-12-29 ENCOUNTER — Ambulatory Visit (INDEPENDENT_AMBULATORY_CARE_PROVIDER_SITE_OTHER): Payer: BC Managed Care – PPO | Admitting: Internal Medicine

## 2010-12-29 VITALS — BP 120/78 | HR 72 | Temp 98.2°F | Resp 14 | Ht 64.5 in | Wt 132.0 lb

## 2010-12-29 DIAGNOSIS — B351 Tinea unguium: Secondary | ICD-10-CM

## 2010-12-29 DIAGNOSIS — Z0000 Encounter for general adult medical examination without abnormal findings: Secondary | ICD-10-CM

## 2010-12-29 MED ORDER — CICLOPIROX 8 % EX SOLN: Freq: Every day | CUTANEOUS | Status: DC

## 2010-12-29 NOTE — Patient Instructions (Signed)
The patient is instructed to continue all medications as prescribed. Schedule followup with check out clerk upon leaving the clinic  

## 2010-12-29 NOTE — Progress Notes (Signed)
Subjective:    Patient ID: Erica Kramer, female    DOB: February 07, 1955, 55 y.o.   MRN: [id number]  HPI Has completed all HM issues and has call in for colon    Review of Systems  Constitutional: Negative for activity change, appetite change and fatigue.  HENT: Negative for ear pain, congestion, neck pain, postnasal drip and sinus pressure.   Eyes: Negative for redness and visual disturbance.  Respiratory: Negative for cough, shortness of breath and wheezing.   Gastrointestinal: Negative for abdominal pain and abdominal distention.  Genitourinary: Negative for dysuria, frequency and menstrual problem.  Musculoskeletal: Negative for myalgias, joint swelling and arthralgias.  Skin: Negative for rash and wound.  Neurological: Negative for dizziness, weakness and headaches.  Hematological: Negative for adenopathy. Does not bruise/bleed easily.  Psychiatric/Behavioral: Negative for sleep disturbance and decreased concentration.       Past Medical History  Diagnosis Date  . Glaucoma   . GERD (gastroesophageal reflux disease)   . Colon polyps   . Diverticulosis, acute   . Anxiety   . Headache   . Rosacea   . DJD (degenerative joint disease)     History   Social History  . Marital Status: Married    Spouse Name: N/A    Number of Children: N/A  . Years of Education: N/A   Occupational History  . Not on file.   Social History Main Topics  . Smoking status: Never Smoker   . Smokeless tobacco: Not on file  . Alcohol Use: No  . Drug Use: No  . Sexually Active: Not on file   Other Topics Concern  . Not on file   Social History Narrative  . No narrative on file    Past Surgical History  Procedure Date  . Abdominal hysterectomy   . Tonsillectomy     Family History  Problem Relation Age of Onset  . Heart disease    . Cancer    . Diabetes      No Known Allergies  No current outpatient prescriptions on file prior to visit.    BP 120/78  Pulse 72  Temp 98.2 F  (36.8 C)  Resp 14  Ht 5' 4.5" (1.638 m)  Wt 132 lb (59.875 kg)  BMI 22.31 kg/m2    Objective:   Physical Exam  Nursing note and vitals reviewed. Constitutional: She is oriented to person, place, and time. She appears well-developed and well-nourished. No distress.  HENT:  Head: Normocephalic and atraumatic.  Right Ear: External ear normal.  Left Ear: External ear normal.  Nose: Nose normal.  Mouth/Throat: Oropharynx is clear and moist.  Eyes: Conjunctivae and EOM are normal. Pupils are equal, round, and reactive to light.  Neck: Normal range of motion. Neck supple. No JVD present. No tracheal deviation present. No thyromegaly present.  Cardiovascular: Normal rate, regular rhythm, normal heart sounds and intact distal pulses.   No murmur heard. Pulmonary/Chest: Effort normal and breath sounds normal. She has no wheezes. She exhibits no tenderness.  Abdominal: Soft. Bowel sounds are normal.  Musculoskeletal: Normal range of motion. She exhibits no edema and no tenderness.  Lymphadenopathy:    She has no cervical adenopathy.  Neurological: She is alert and oriented to person, place, and time. She has normal reflexes. No cranial nerve deficit.  Skin: Skin is warm and dry. She is not diaphoretic.  Psychiatric: She has a normal mood and affect. Her behavior is normal.          Assessment &  Plan:   This is a routine physical examination for this healthy  Female. Reviewed all health maintenance protocols including mammography colonoscopy bone density and reviewed appropriate screening labs. Her immunization history was reviewed as well as her current medications and allergies refills of her chronic medications were given and the plan for yearly health maintenance was discussed all orders and referrals were made as appropriate.

## 2011-02-13 ENCOUNTER — Encounter: Payer: Self-pay | Admitting: Gastroenterology

## 2011-02-13 ENCOUNTER — Ambulatory Visit (AMBULATORY_SURGERY_CENTER): Payer: BC Managed Care – PPO

## 2011-02-13 VITALS — Ht 64.5 in | Wt 138.7 lb

## 2011-02-13 DIAGNOSIS — Z86010 Personal history of colonic polyps: Secondary | ICD-10-CM

## 2011-02-13 DIAGNOSIS — Z1211 Encounter for screening for malignant neoplasm of colon: Secondary | ICD-10-CM

## 2011-02-13 MED ORDER — PEG-KCL-NACL-NASULF-NA ASC-C 100 G PO SOLR: 1.0000 | Freq: Once | ORAL | Status: AC

## 2011-02-14 ENCOUNTER — Telehealth: Payer: Self-pay | Admitting: Gastroenterology

## 2011-02-14 NOTE — Telephone Encounter (Signed)
All questions answered

## 2011-02-23 ENCOUNTER — Encounter: Payer: Self-pay | Admitting: Gastroenterology

## 2011-02-23 ENCOUNTER — Ambulatory Visit (AMBULATORY_SURGERY_CENTER): Payer: BC Managed Care – PPO | Admitting: Gastroenterology

## 2011-02-23 DIAGNOSIS — Z860100 Personal history of colon polyps, unspecified: Secondary | ICD-10-CM

## 2011-02-23 DIAGNOSIS — Z1211 Encounter for screening for malignant neoplasm of colon: Secondary | ICD-10-CM

## 2011-02-23 MED ORDER — SODIUM CHLORIDE 0.9 % IV SOLN: 500.0000 mL | INTRAVENOUS | Status: DC

## 2011-02-23 NOTE — Op Note (Signed)
[facility] [address] [place], [place]  [address]  COLONOSCOPY PROCEDURE REPORT  PATIENT:  Erica Kramer, Erica Kramer  MR#:  [id number] BIRTHDATE:  [birth year], 55 yrs. old  GENDER:  female ENDOSCOPIST:  Judie Petit T. Russella Dar, MD, [facility]  PROCEDURE DATE:  02/23/2011 PROCEDURE:  Colonoscopy 62130 ASA CLASS:  Class II INDICATIONS:  1) surveillance and high-risk screening  2) history of pre-cancerous (adenomatous) colon polyps: 10/2005. MEDICATIONS:   MAC sedation, administered by CRNA, propofol (Diprivan) 250 mg IV DESCRIPTION OF PROCEDURE:   After the risks benefits and alternatives of the procedure were thoroughly explained, informed consent was obtained.  Digital rectal exam was performed and revealed no abnormalities.   The LB CF-H180AL [id number] endoscope was introduced through the anus and advanced to the cecum, which was identified by both the appendix and ileocecal valve, without limitations.  The quality of the prep was good, using MoviPrep. The instrument was then slowly withdrawn as the colon was fully examined. <<PROCEDUREIMAGES>> FINDINGS:  A normal appearing cecum, ileocecal valve, and appendiceal orifice were identified. The ascending, hepatic flexure, transverse, splenic flexure, descending, sigmoid colon, and rectum appeared unremarkable. Retroflexed views in the rectum revealed no abnormalities.  The time to cecum =  2.33  minutes. The scope was then withdrawn (time =  10  min) from the patient and the procedure completed.  COMPLICATIONS:  None  ENDOSCOPIC IMPRESSION: 1) Normal colon  RECOMMENDATIONS: 1) High fiber diet with liberal fluid intake. 2) Repeat Colonoscopy in 5 years.  Venita Lick. Russella Dar, MD, [facility]  n. eSIGNED:   Venita Lick. Yuri Flener at 02/23/2011 10:40 AM  [facility], [phone]

## 2011-02-23 NOTE — Progress Notes (Signed)
Patient did not experience any of the following events: a burn prior to discharge; a fall within the facility; wrong site/side/patient/procedure/implant event; or a hospital transfer or hospital admission upon discharge from the facility. (G8907) Patient did not have preoperative order for IV antibiotic SSI prophylaxis. (G8918)  

## 2011-02-23 NOTE — Patient Instructions (Signed)
FOLLOW DISCHARGE INSTRUCTIONS (BLUE & GREEN SHEETS).  HIGH FIBER DIET INFORMATION GIVEN TO YOU.

## 2011-02-26 ENCOUNTER — Telehealth: Payer: Self-pay | Admitting: *Deleted

## 2011-02-26 NOTE — Telephone Encounter (Signed)
  Follow up Call-  Call back number 02/23/2011  Post procedure Call Back phone  # [phone]  Permission to leave phone message Yes     Patient questions:  Do you have a fever, pain , or abdominal swelling? no Pain Score  0 *  Have you tolerated food without any problems? yes  Have you been able to return to your normal activities? yes  Do you have any questions about your discharge instructions: Diet   no Medications  no Follow up visit  no  Do you have questions or concerns about your Care? no  Actions: * If pain score is 4 or above: No action needed, pain <4.

## 2011-02-27 ENCOUNTER — Other Ambulatory Visit: Payer: Self-pay | Admitting: Obstetrics and Gynecology

## 2011-02-27 DIAGNOSIS — Z1231 Encounter for screening mammogram for malignant neoplasm of breast: Secondary | ICD-10-CM

## 2011-03-12 ENCOUNTER — Encounter: Payer: Self-pay | Admitting: Family

## 2011-03-12 ENCOUNTER — Other Ambulatory Visit (INDEPENDENT_AMBULATORY_CARE_PROVIDER_SITE_OTHER): Payer: Self-pay | Admitting: General Surgery

## 2011-03-12 ENCOUNTER — Encounter (HOSPITAL_COMMUNITY): Payer: Self-pay | Admitting: Emergency Medicine

## 2011-03-12 ENCOUNTER — Telehealth: Payer: Self-pay | Admitting: Gastroenterology

## 2011-03-12 ENCOUNTER — Emergency Department (HOSPITAL_COMMUNITY): Payer: BC Managed Care – PPO | Admitting: Anesthesiology

## 2011-03-12 ENCOUNTER — Ambulatory Visit
Admission: RE | Admit: 2011-03-12 | Discharge: 2011-03-12 | Disposition: A | Discharge status: Home / Self Care | Payer: BC Managed Care – PPO | Source: Ambulatory Visit | Attending: Family | Admitting: Family

## 2011-03-12 ENCOUNTER — Encounter (HOSPITAL_COMMUNITY): Payer: Self-pay | Admitting: Anesthesiology

## 2011-03-12 ENCOUNTER — Other Ambulatory Visit: Payer: Self-pay

## 2011-03-12 ENCOUNTER — Ambulatory Visit (INDEPENDENT_AMBULATORY_CARE_PROVIDER_SITE_OTHER): Payer: BC Managed Care – PPO | Admitting: Family

## 2011-03-12 ENCOUNTER — Other Ambulatory Visit: Payer: Self-pay | Admitting: Family

## 2011-03-12 ENCOUNTER — Ambulatory Visit (HOSPITAL_COMMUNITY)
Admission: EM | Admit: 2011-03-12 | Discharge: 2011-03-13 | Disposition: A | Discharge status: Home / Self Care | Payer: BC Managed Care – PPO | Attending: General Surgery | Admitting: General Surgery

## 2011-03-12 ENCOUNTER — Encounter (HOSPITAL_COMMUNITY)
Admission: EM | Disposition: A | Discharge status: Home / Self Care | Payer: Self-pay | Source: Home / Self Care | Attending: Emergency Medicine

## 2011-03-12 ENCOUNTER — Telehealth: Payer: Self-pay

## 2011-03-12 ENCOUNTER — Telehealth: Payer: Self-pay | Admitting: Family

## 2011-03-12 VITALS — BP 120/68 | Temp 97.9°F | Wt 132.0 lb

## 2011-03-12 DIAGNOSIS — K5900 Constipation, unspecified: Secondary | ICD-10-CM | POA: Insufficient documentation

## 2011-03-12 DIAGNOSIS — R112 Nausea with vomiting, unspecified: Secondary | ICD-10-CM

## 2011-03-12 DIAGNOSIS — K3580 Unspecified acute appendicitis: Secondary | ICD-10-CM | POA: Insufficient documentation

## 2011-03-12 DIAGNOSIS — Z860100 Personal history of colon polyps, unspecified: Secondary | ICD-10-CM | POA: Insufficient documentation

## 2011-03-12 DIAGNOSIS — Z90710 Acquired absence of both cervix and uterus: Secondary | ICD-10-CM | POA: Insufficient documentation

## 2011-03-12 DIAGNOSIS — K219 Gastro-esophageal reflux disease without esophagitis: Secondary | ICD-10-CM | POA: Insufficient documentation

## 2011-03-12 DIAGNOSIS — Z79899 Other long term (current) drug therapy: Secondary | ICD-10-CM | POA: Insufficient documentation

## 2011-03-12 DIAGNOSIS — R1031 Right lower quadrant pain: Secondary | ICD-10-CM

## 2011-03-12 DIAGNOSIS — Z01810 Encounter for preprocedural cardiovascular examination: Secondary | ICD-10-CM | POA: Insufficient documentation

## 2011-03-12 DIAGNOSIS — K37 Unspecified appendicitis: Secondary | ICD-10-CM

## 2011-03-12 DIAGNOSIS — K224 Dyskinesia of esophagus: Secondary | ICD-10-CM

## 2011-03-12 DIAGNOSIS — R509 Fever, unspecified: Secondary | ICD-10-CM | POA: Insufficient documentation

## 2011-03-12 HISTORY — DX: Other specified postprocedural states: Z98.890

## 2011-03-12 HISTORY — DX: Nausea with vomiting, unspecified: R11.2

## 2011-03-12 HISTORY — PX: LAPAROSCOPIC APPENDECTOMY: SHX408

## 2011-03-12 LAB — CBC WITH DIFFERENTIAL/PLATELET
Basophils Absolute: 0 10*3/uL (ref 0.0–0.1)
Basophils Relative: 0 % (ref 0.0–3.0)
Eosinophils Absolute: 0 10*3/uL (ref 0.0–0.7)
Eosinophils Relative: 0 % (ref 0.0–5.0)
HCT: 40.5 % (ref 36.0–46.0)
Hemoglobin: 13.9 g/dL (ref 12.0–15.0)
Lymphocytes Relative: 3.2 % — ABNORMAL LOW (ref 12.0–46.0)
Lymphs Abs: 0.4 10*3/uL — ABNORMAL LOW (ref 0.7–4.0)
MCHC: 34.4 g/dL (ref 30.0–36.0)
MCV: 95.7 fl (ref 78.0–100.0)
Monocytes Absolute: 0.7 10*3/uL (ref 0.1–1.0)
Monocytes Relative: 5.3 % (ref 3.0–12.0)
Neutro Abs: 12.2 10*3/uL — ABNORMAL HIGH (ref 1.4–7.7)
Neutrophils Relative %: 91.5 % — ABNORMAL HIGH (ref 43.0–77.0)
Platelets: 174 10*3/uL (ref 150.0–400.0)
RBC: 4.23 Mil/uL (ref 3.87–5.11)
RDW: 12.5 % (ref 11.5–14.6)
WBC: 13.3 10*3/uL — ABNORMAL HIGH (ref 4.5–10.5)

## 2011-03-12 LAB — BASIC METABOLIC PANEL
BUN: 15 mg/dL (ref 6–23)
CO2: 27 mEq/L (ref 19–32)
Calcium: 9.1 mg/dL (ref 8.4–10.5)
Chloride: 105 mEq/L (ref 96–112)
Creatinine, Ser: 0.8 mg/dL (ref 0.4–1.2)
GFR: 80.76 mL/min (ref >60.00)
Glucose, Bld: 128 mg/dL — ABNORMAL HIGH (ref 70–99)
Potassium: 3.7 mEq/L (ref 3.5–5.1)
Sodium: 140 mEq/L (ref 135–145)

## 2011-03-12 LAB — LIPASE: Lipase: 32 U/L (ref 11.0–59.0)

## 2011-03-12 LAB — AMYLASE: Amylase: 77 U/L (ref 27–131)

## 2011-03-12 SURGERY — APPENDECTOMY, LAPAROSCOPIC
Anesthesia: General | Site: Abdomen | Wound class: Contaminated

## 2011-03-12 MED ORDER — MORPHINE SULFATE 4 MG/ML IJ SOLN
4.0000 mg | Freq: Once | INTRAMUSCULAR | Status: AC
  Administered 2011-03-12: 4 mg via INTRAVENOUS
  Filled 2011-03-12: qty 1

## 2011-03-12 MED ORDER — SUCCINYLCHOLINE CHLORIDE 20 MG/ML IJ SOLN
INTRAMUSCULAR | Status: DC | PRN
  Administered 2011-03-12: 80 mg via INTRAVENOUS

## 2011-03-12 MED ORDER — ESOMEPRAZOLE MAGNESIUM 40 MG PO CPDR: 40.0000 mg | DELAYED_RELEASE_CAPSULE | Freq: Every day | ORAL | Status: DC

## 2011-03-12 MED ORDER — GLYCOPYRROLATE 0.2 MG/ML IJ SOLN
INTRAMUSCULAR | Status: DC | PRN
  Administered 2011-03-12: .5 mg via INTRAVENOUS

## 2011-03-12 MED ORDER — HYDROCODONE-ACETAMINOPHEN 5-500 MG PO TABS: 1.0000 | ORAL_TABLET | Freq: Three times a day (TID) | ORAL | Status: DC | PRN

## 2011-03-12 MED ORDER — HYDROMORPHONE HCL PF 1 MG/ML IJ SOLN: 0.2500 mg | INTRAMUSCULAR | Status: DC | PRN

## 2011-03-12 MED ORDER — PROMETHAZINE HCL 25 MG/ML IJ SOLN: 6.2500 mg | INTRAMUSCULAR | Status: DC | PRN

## 2011-03-12 MED ORDER — LACTATED RINGERS IV SOLN: INTRAVENOUS | Status: DC

## 2011-03-12 MED ORDER — DEXAMETHASONE SODIUM PHOSPHATE 10 MG/ML IJ SOLN
INTRAMUSCULAR | Status: DC | PRN
  Administered 2011-03-12: 10 mg via INTRAVENOUS

## 2011-03-12 MED ORDER — CISATRACURIUM BESYLATE 2 MG/ML IV SOLN
INTRAVENOUS | Status: DC | PRN
  Administered 2011-03-12: 4 mg via INTRAVENOUS
  Administered 2011-03-12: 2 mg via INTRAVENOUS

## 2011-03-12 MED ORDER — BUPIVACAINE-EPINEPHRINE PF 0.25-1:200000 % IJ SOLN
INTRAMUSCULAR | Status: AC
  Filled 2011-03-12: qty 30

## 2011-03-12 MED ORDER — PROPOFOL 10 MG/ML IV BOLUS
INTRAVENOUS | Status: DC | PRN
  Administered 2011-03-12: 120 mg via INTRAVENOUS

## 2011-03-12 MED ORDER — PANTOPRAZOLE SODIUM 40 MG IV SOLR
40.0000 mg | Freq: Every day | INTRAVENOUS | Status: DC
  Filled 2011-03-12 (×2): qty 40

## 2011-03-12 MED ORDER — MORPHINE SULFATE 4 MG/ML IJ SOLN: 4.0000 mg | INTRAMUSCULAR | Status: DC | PRN

## 2011-03-12 MED ORDER — PROMETHAZINE HCL 25 MG PO TABS: 12.5000 mg | ORAL_TABLET | Freq: Three times a day (TID) | ORAL | Status: AC | PRN

## 2011-03-12 MED ORDER — PROMETHAZINE HCL 25 MG/ML IJ SOLN
25.0000 mg | Freq: Once | INTRAMUSCULAR | Status: AC
  Administered 2011-03-12: 25 mg via INTRAMUSCULAR

## 2011-03-12 MED ORDER — LACTATED RINGERS IV SOLN
INTRAVENOUS | Status: DC | PRN
  Administered 2011-03-12: 22:00:00 via INTRAVENOUS

## 2011-03-12 MED ORDER — IOHEXOL 300 MG/ML  SOLN
100.0000 mL | Freq: Once | INTRAMUSCULAR | Status: AC | PRN
  Administered 2011-03-12: 100 mL via INTRAVENOUS

## 2011-03-12 MED ORDER — ONDANSETRON HCL 4 MG/2ML IJ SOLN
INTRAMUSCULAR | Status: DC | PRN
  Administered 2011-03-12: 4 mg via INTRAVENOUS

## 2011-03-12 MED ORDER — KCL IN DEXTROSE-NACL 20-5-0.9 MEQ/L-%-% IV SOLN
INTRAVENOUS | Status: DC
  Filled 2011-03-12 (×3): qty 1000

## 2011-03-12 MED ORDER — NEOSTIGMINE METHYLSULFATE 1 MG/ML IJ SOLN
INTRAMUSCULAR | Status: DC | PRN
  Administered 2011-03-12: 3 mg via INTRAVENOUS

## 2011-03-12 MED ORDER — FENTANYL CITRATE 0.05 MG/ML IJ SOLN
INTRAMUSCULAR | Status: DC | PRN
  Administered 2011-03-12 (×2): 50 ug via INTRAVENOUS
  Administered 2011-03-12: 100 ug via INTRAVENOUS

## 2011-03-12 MED ORDER — ONDANSETRON HCL 4 MG/2ML IJ SOLN: 4.0000 mg | Freq: Four times a day (QID) | INTRAMUSCULAR | Status: DC | PRN

## 2011-03-12 MED ORDER — ONDANSETRON HCL 4 MG/2ML IJ SOLN
4.0000 mg | Freq: Once | INTRAMUSCULAR | Status: AC
  Administered 2011-03-12: 4 mg via INTRAVENOUS
  Filled 2011-03-12: qty 2

## 2011-03-12 MED ORDER — SODIUM CHLORIDE 0.9 % IV SOLN
1.0000 g | INTRAVENOUS | Status: DC
  Administered 2011-03-12: 1 g via INTRAVENOUS
  Filled 2011-03-12 (×2): qty 1

## 2011-03-12 MED ORDER — LIDOCAINE HCL (CARDIAC) 20 MG/ML IV SOLN
INTRAVENOUS | Status: DC | PRN
  Administered 2011-03-12: 50 mg via INTRAVENOUS

## 2011-03-12 MED ORDER — MIDAZOLAM HCL 5 MG/5ML IJ SOLN
INTRAMUSCULAR | Status: DC | PRN
  Administered 2011-03-12: 2 mg via INTRAVENOUS

## 2011-03-12 MED ORDER — BUPIVACAINE-EPINEPHRINE 0.25% -1:200000 IJ SOLN
INTRAMUSCULAR | Status: DC | PRN
  Administered 2011-03-12: 20 mL

## 2011-03-12 SURGICAL SUPPLY — 40 items
ADH SKN CLS APL DERMABOND .7 (GAUZE/BANDAGES/DRESSINGS) ×1
APPLIER CLIP ROT 10 11.4 M/L (STAPLE)
APR CLP MED LRG 11.4X10 (STAPLE)
BAG SPEC RTRVL LRG 6X4 10 (ENDOMECHANICALS)
CANISTER SUCTION 2500CC (MISCELLANEOUS) ×2 IMPLANT
CLIP APPLIE ROT 10 11.4 M/L (STAPLE) IMPLANT
CLOTH BEACON ORANGE TIMEOUT ST (SAFETY) ×2 IMPLANT
CUTTER FLEX LINEAR 45M (STAPLE) IMPLANT
DECANTER SPIKE VIAL GLASS SM (MISCELLANEOUS) ×2 IMPLANT
DERMABOND ADVANCED (GAUZE/BANDAGES/DRESSINGS) ×1
DERMABOND ADVANCED .7 DNX12 (GAUZE/BANDAGES/DRESSINGS) ×1 IMPLANT
DRAPE LAPAROSCOPIC ABDOMINAL (DRAPES) ×2 IMPLANT
ELECT REM PT RETURN 9FT ADLT (ELECTROSURGICAL) ×2
ELECTRODE REM PT RTRN 9FT ADLT (ELECTROSURGICAL) ×1 IMPLANT
ENDOLOOP SUT PDS II  0 18 (SUTURE)
ENDOLOOP SUT PDS II 0 18 (SUTURE) IMPLANT
GLOVE BIO SURGEON STRL SZ7.5 (GLOVE) ×4 IMPLANT
GOWN PREVENTION PLUS XLARGE (GOWN DISPOSABLE) ×2 IMPLANT
GOWN STRL NON-REIN LRG LVL3 (GOWN DISPOSABLE) ×2 IMPLANT
GOWN STRL REIN XL XLG (GOWN DISPOSABLE) ×2 IMPLANT
HAND ACTIVATED (MISCELLANEOUS) IMPLANT
IV LACTATED RINGERS 1000ML (IV SOLUTION) ×2 IMPLANT
KIT BASIN OR (CUSTOM PROCEDURE TRAY) ×2 IMPLANT
NS IRRIG 1000ML POUR BTL (IV SOLUTION) ×2 IMPLANT
PENCIL BUTTON HOLSTER BLD 10FT (ELECTRODE) ×2 IMPLANT
POUCH SPECIMEN RETRIEVAL 10MM (ENDOMECHANICALS) IMPLANT
RELOAD 45 VASCULAR/THIN (ENDOMECHANICALS) IMPLANT
RELOAD STAPLE 45 3.5 BLU ETS (ENDOMECHANICALS) IMPLANT
RELOAD STAPLE TA45 3.5 REG BLU (ENDOMECHANICALS) IMPLANT
SET IRRIG TUBING LAPAROSCOPIC (IRRIGATION / IRRIGATOR) ×2 IMPLANT
SOLUTION ANTI FOG 6CC (MISCELLANEOUS) ×2 IMPLANT
SUT MNCRL AB 4-0 PS2 18 (SUTURE) ×2 IMPLANT
TOWEL OR 17X26 10 PK STRL BLUE (TOWEL DISPOSABLE) ×2 IMPLANT
TRAY FOLEY CATH 14FRSI W/METER (CATHETERS) ×2 IMPLANT
TRAY LAP CHOLE (CUSTOM PROCEDURE TRAY) ×2 IMPLANT
TROCAR BLADELESS OPT 5 75 (ENDOMECHANICALS) ×2 IMPLANT
TROCAR XCEL 12X100 BLDLESS (ENDOMECHANICALS) ×2 IMPLANT
TROCAR XCEL BLUNT TIP 100MML (ENDOMECHANICALS) ×2 IMPLANT
TROCAR XCEL NON-BLD 11X100MML (ENDOMECHANICALS) ×2 IMPLANT
TUBING INSUFFLATION 10FT LAP (TUBING) ×2 IMPLANT

## 2011-03-12 NOTE — Telephone Encounter (Signed)
VICODIN) 5-500 MG per tablet - states pharmacy did not receive request for this

## 2011-03-12 NOTE — Telephone Encounter (Signed)
Patient c/o pain and cramping that started last night about 10:00.  She has had one episode of vomiting.  Her pain started in the RLQ and then was periumbilical and now she describes it as epigastric and burning.  She denies diarrhea or fever.  She wants to be seen this am.  I have offered her an appt for today with Willette Cluster RNP at 1:30, she refuses this appt and states "I have waited all night and can't wait any longer:".  She states she needs to be seen right now and will go to the ER.  I again asked her if she wanted the appt for this afternoon and she refused.

## 2011-03-12 NOTE — H&P (Signed)
Erica Kramer is an 56 y.o. female.   Chief Complaint: abd pain HPI: 56 yo wf presents with abd pain that started at 10pm last night. She has had nausea and vomiting. Some fever. Went to regular medical doc who got a CT today that showed appendicitis.  Past Medical History  Diagnosis Date  . Glaucoma   . GERD (gastroesophageal reflux disease)   . Colon polyps   . Diverticulosis, acute   . Anxiety   . Headache   . Rosacea   . DJD (degenerative joint disease)   . Macular degeneration syndrome   . PONV (postoperative nausea and vomiting)     Past Surgical History  Procedure Date  . Abdominal hysterectomy   . Tonsillectomy   . Colonoscopy   . Polypectomy     Family History  Problem Relation Age of Onset  . Heart disease    . Cancer    . Diabetes    . Breast cancer Mother    Social History:  reports that she has never smoked. She has never used smokeless tobacco. She reports that she drinks alcohol. She reports that she does not use illicit drugs.  Allergies: No Known Allergies  Medications Prior to Admission  Medication Dose Route Frequency Provider Last Rate Last Dose  . iohexol (OMNIPAQUE) 300 MG/ML solution 100 mL  100 mL Intravenous Once PRN Medication Radiologist, MD   100 mL at 03/12/11 1741  . morphine 4 MG/ML injection 4 mg  4 mg Intravenous Once Otilio Miu, PA   4 mg at 03/12/11 1854  . morphine 4 MG/ML injection 4 mg  4 mg Intravenous Once Gerhard Munch, MD   4 mg at 03/12/11 2042  . ondansetron (ZOFRAN) injection 4 mg  4 mg Intravenous Once Otilio Miu, PA   4 mg at 03/12/11 1854  . ondansetron (ZOFRAN) injection 4 mg  4 mg Intravenous Once Gerhard Munch, MD   4 mg at 03/12/11 2039  . promethazine (PHENERGAN) injection 25 mg  25 mg Intramuscular Once Adline Mango, FNP   25 mg at 03/12/11 0930   Medications Prior to Admission  Medication Sig Dispense Refill  . calcium-vitamin D (OSCAL WITH D) 500-200 MG-UNIT per tablet Take 1 tablet  by mouth daily.        Marland Kitchen estradiol (VIVELLE-DOT) 0.0375 MG/24HR Place 1 patch onto the skin once a week. Apply once a week      . latanoprost (XALATAN) 0.005 % ophthalmic solution Place 1 drop into both eyes at bedtime.        Marland Kitchen loratadine (CLARITIN) 10 MG tablet Take 10 mg by mouth daily as needed.       . Multiple Vitamins-Minerals (VISION-VITE PRESERVE PO) Take by mouth. Take 4-5 pills weekly        Results for orders placed in visit on 03/12/11 (from the past 48 hour(s))  CBC WITH DIFFERENTIAL     Status: Abnormal   Collection Time   03/12/11 10:11 AM      Component Value Range Comment   WBC 13.3 (*) 4.5 - 10.5 (K/uL)    RBC 4.23  3.87 - 5.11 (Mil/uL)    Hemoglobin 13.9  12.0 - 15.0 (g/dL)    HCT 62.1  30.8 - 65.7 (%)    MCV 95.7  78.0 - 100.0 (fl)    MCHC 34.4  30.0 - 36.0 (g/dL)    RDW 84.6  96.2 - 95.2 (%)    Platelets 174.0  150.0 - 400.0 (  K/uL)    Neutrophils Relative 91.5 (*) 43.0 - 77.0 (%) Manual smear review agrees with instrument differential.   Lymphocytes Relative 3.2 (*) 12.0 - 46.0 (%)    Monocytes Relative 5.3  3.0 - 12.0 (%)    Eosinophils Relative 0.0  0.0 - 5.0 (%)    Basophils Relative 0.0  0.0 - 3.0 (%)    Neutro Abs 12.2 (*) 1.4 - 7.7 (K/uL)    Lymphs Abs 0.4 (*) 0.7 - 4.0 (K/uL)    Monocytes Absolute 0.7  0.1 - 1.0 (K/uL)    Eosinophils Absolute 0.0  0.0 - 0.7 (K/uL)    Basophils Absolute 0.0  0.0 - 0.1 (K/uL)   BASIC METABOLIC PANEL     Status: Abnormal   Collection Time   03/12/11 10:11 AM      Component Value Range Comment   Sodium 140  135 - 145 (mEq/L)    Potassium 3.7  3.5 - 5.1 (mEq/L)    Chloride 105  96 - 112 (mEq/L)    CO2 27  19 - 32 (mEq/L)    Glucose, Bld 128 (*) 70 - 99 (mg/dL)    BUN 15  6 - 23 (mg/dL)    Creatinine, Ser 0.8  0.4 - 1.2 (mg/dL)    Calcium 9.1  8.4 - 10.5 (mg/dL)    GFR 14.78  >29.56 (mL/min)   AMYLASE     Status: Normal   Collection Time   03/12/11 10:11 AM      Component Value Range Comment   Amylase 77  27 - 131  (U/L)   LIPASE     Status: Normal   Collection Time   03/12/11 10:11 AM      Component Value Range Comment   Lipase 32.0  11.0 - 59.0 (U/L)    Ct Abdomen Pelvis W Contrast  03/12/2011  *RADIOLOGY REPORT*  Clinical Data: Abdominal pain, nausea and vomiting, constipation  CT ABDOMEN AND PELVIS WITH CONTRAST  Technique:  Multidetector CT imaging of the abdomen and pelvis was performed following the standard protocol during bolus administration of intravenous contrast.  Contrast: OMNIPAQUE IOHEXOL 300 MG/ML IV SOLN  Comparison: None.  Findings: The lung bases are clear.  The liver enhances with no focal abnormality and no ductal dilatation is seen.  No calcified gallstones are noted.  The pancreas is normal in size and the pancreatic duct is not dilated.  The adrenal glands and spleen are unremarkable.  The stomach is fluid distended with no abnormality noted.  The kidneys enhance with no calculus or mass and no hydronephrosis is seen.  The abdominal aorta is normal in caliber. Only small retroperitoneal nodes are present.  The terminal ileum is unremarkable.  However the appendix is dilated up to 14 mm with some enhancement of the wall consistent with acute appendicitis.  No evidence of perforation is seen currently.  Mild periappendiceal strandiness is noted.  No fluid is noted within the pelvis.  The urinary bladder is decompressed and unremarkable.  The history given is no surgery, but the uterus is not visualized and may have been resected.  No adnexal lesion is seen.  IMPRESSION: Dilated appendix up to 14 mm consistent with acute appendicitis. No evidence of perforation.  This study has remained a call report and will be called to Dr. Orvan Falconer.  Original Report Authenticated By: Juline Patch, M.D.    Review of Systems  Constitutional: Positive for fever.  HENT: Negative.   Eyes: Negative.   Respiratory:  Negative.   Cardiovascular: Negative.   Gastrointestinal: Positive for heartburn, nausea,  vomiting and abdominal pain.  Genitourinary: Negative.   Musculoskeletal: Negative.   Skin: Negative.   Neurological: Negative.   Endo/Heme/Allergies: Negative.   Psychiatric/Behavioral: Negative.     Blood pressure 127/83, pulse 106, temperature 97.9 F (36.6 C), temperature source Oral, resp. rate 20, SpO2 100.00%. Physical Exam  Constitutional: She is oriented to person, place, and time. She appears well-developed and well-nourished.  HENT:  Head: Normocephalic and atraumatic.  Eyes: Conjunctivae and EOM are normal. Pupils are equal, round, and reactive to light.  Neck: Normal range of motion. Neck supple.  Cardiovascular: Normal rate, regular rhythm and normal heart sounds.   Respiratory: Effort normal and breath sounds normal.  GI: Soft. Bowel sounds are normal.       Tender RLQ with guarding. No peritonitis  Musculoskeletal: Normal range of motion.  Neurological: She is alert and oriented to person, place, and time.  Skin: Skin is warm and dry.  Psychiatric: She has a normal mood and affect. Her behavior is normal.     Assessment/Plan Acute appendicitis. Because of risk of perforation and sepsis i think she would benefit from having it removed. I have discussed the risks and benefits of surgery with pt and she understands and wishes to proceed  TOTH III,Burlin Mcnair S 03/12/2011, 9:55 PM

## 2011-03-12 NOTE — Brief Op Note (Signed)
03/12/2011  11:45 PM  PATIENT:  Erica Kramer  56 y.o. female  PRE-OPERATIVE DIAGNOSIS:  acute appendicitis  POST-OPERATIVE DIAGNOSIS:  acute appendicitis  PROCEDURE:  Procedure(s) (LRB): APPENDECTOMY LAPAROSCOPIC (N/A)  SURGEON:  Surgeon(s) and Role:    * Robyne Askew, MD - Primary  PHYSICIAN ASSISTANT:   ASSISTANTS: none   ANESTHESIA:   general  EBL:     BLOOD ADMINISTERED:none  DRAINS: none   LOCAL MEDICATIONS USED:  MARCAINE     SPECIMEN:  Source of Specimen:  appendix  DISPOSITION OF SPECIMEN:  PATHOLOGY  COUNTS:  YES  TOURNIQUET:  * No tourniquets in log *  DICTATION: .Dragon Dictation After informed consent was obtained patient was brought to the operating room placed in the supine position on the operating room table. After adequate induction of general anesthesia the patient's abdomen was prepped with ChloraPrep, allowed to dry, and draped in usual sterile manner. The area below the umbilicus was infiltrated with quarter percent Marcaine. A small incision was made with a 15 blade knife. This incision was carried down through the subcutaneous tissue bluntly with a hemostat and Army-Navy retractors until the linea alba was identified. The linea alba was incised with a 15 blade knife. Each side was grasped Coker clamps and elevated anteriorly. The preperitoneal space was probed bluntly with a hemostat until the peritoneum was opened and access was gained to the abdominal cavity. A 0 Vicryl purse string stitch was placed in the fascia surrounding the opening. A Hassan cannula was placed through the opening and anchored in place with the previously placed Vicryl purse string stitch. The laparoscope was placed through the [facility] cannula. The abdomen was insufflated with carbon dioxide without difficulty. nnext the suprapubic area was infiltrated with quarter percent Marcaine. A small incision was made with a 15 blade knife. A 10 mm port was placed bluntly through this  incision into the abdominal cavity. A site was then chosen between the 2 port for placement of a 5 mm port. The area was infiltrated with quarter percent Marcaine. A small stab incision was made with a 15 blade knife. A 5 mm port was placed bluntly through this incision and the abdominal cavity under direct vision. The laparoscope was then moved to the suprapubic port. Using a Glassman grasper and harmonic scalpel the right lower quadrant was inspected. The appendix was readily identified. The appendix was elevated anteriorly and the mesoappendix was taken down sharply with the harmonic scalpel. Once the base of the appendix where it joined the cecum was identified and cleared of any tissue then a laparoscopic GIA blue load 6 row stapler was placed through the [facility] cannula. The stapler was placed across the base of the appendix clamped and fired thereby dividing the base of the appendix between staple lines. A laparoscopic bag was then inserted through the [facility] cannula. The appendix was placed within the bag and the bag was sealed. The abdomen was then irrigated with copious amounts of saline until the effluent was clear. No other abnormalities were noted. The appendix and bag were removed with the [facility] cannula through the infraumbilical port without difficulty. The fascial defect was closed with the previously placed Vicryl pursestring stitch as well as with another interrupted 0 Vicryl figure-of-eight stitch. The rest of the ports were removed under direct vision and were found to be hemostatic. The gas was allowed to escape. The skin incisions were closed with interrupted 4-0 Monocryl subcuticular stitches. Dermabond dressings were applied. The patient  tolerated the procedure well. At the end of the case all needle sponge and instrument counts were correct. The patient was then awakened and taken to recovery in stable condition.   PLAN OF CARE: Admit to inpatient   PATIENT DISPOSITION:  PACU -  hemodynamically stable.   Delay start of Pharmacological VTE agent (>24hrs) due to surgical blood loss or risk of bleeding: yes

## 2011-03-12 NOTE — ED Notes (Signed)
MD at bedside. 

## 2011-03-12 NOTE — Telephone Encounter (Signed)
Pt called to advise that she now has a fever of 101 but was afebrile during and before OV. She wants to know if she should still take the medication. Notes that she did take tylenol for the fever.  Pt advised by Adline Mango that CT abdomen and pelvis w/wo contrast will be ordered and that will give a better indication as to what may be causing her pain

## 2011-03-12 NOTE — ED Notes (Signed)
Sent by [facility] due to scan to confirm appendicitis.

## 2011-03-12 NOTE — Patient Instructions (Addendum)
Diet for GERD or PUD Nutrition therapy can help ease the discomfort of gastroesophageal reflux disease (GERD) and peptic ulcer disease (PUD).  HOME CARE INSTRUCTIONS   Eat your meals slowly, in a relaxed setting.   Eat 5 to 6 small meals per day.   If a food causes distress, stop eating it for a period of time.  FOODS TO AVOID  Coffee, regular or decaffeinated.   Cola beverages, regular or low calorie.   Tea, regular or decaffeinated.   Pepper.   Cocoa.   High fat foods, including meats.   Butter, margarine, hydrogenated oil (trans fats).   Peppermint or spearmint (if you have GERD).   Fruits and vegetables if not tolerated.   Alcohol.   Nicotine (smoking or chewing). This is one of the most potent stimulants to acid production in the gastrointestinal tract.   Any food that seems to aggravate your condition.  If you have questions regarding your diet, ask your caregiver or a registered dietitian. TIPS  Lying flat may make symptoms worse. Keep the head of your bed raised 6 to 9 inches (15 to 23 cm) by using a foam wedge or blocks under the legs of the bed.   Do not lay down until 3 hours after eating a meal.   Daily physical activity may help reduce symptoms.  MAKE SURE YOU:   Understand these instructions.   Will watch your condition.   Will get help right away if you are not doing well or get worse.  Document Released: 01/08/2005 Document Revised: 09/20/2010 Document Reviewed: 05/24/2008 [facility] Patient Information 2012 [place], [place].  Esophageal Spasm Esophageal spasm is an uncoordinated contraction of the muscles of the esophagus (the tube which carries food from your mouth to your stomach). Normally, the muscles of the esophagus alternate between contraction and relaxation starting from the top of the esophagus and working down to the bottom. This moves the food from the mouth to the stomach. In esophageal spasm, all the muscles contract at once. This  causes pain and fails to move the food along. As a result, you may have trouble swallowing.  Women are more likely than men to have esophageal spasm. The cause of the spasms is not known. Sometimes eating hot or cold foods triggers the condition and this may be due to an overly sensitive esophagus. This is not an infectious disease and cannot be passed to others. SYMPTOMS  Symptoms of esophageal spasm may include: chest pain, burning or pain with swallowing, and difficulty swallowing.  DIAGNOSIS  Esophageal spasm can be diagnosed by a test called manometry (pressure studies of the esophagus). In this test, a special tube is inserted down the esophagus. The tube measures the muscle activity of the esophagus. Abnormal contractions mixed with normal movement helps confirm the diagnosis.  A person with a hypersensitive esophagus may be diagnosed by inflating a long balloon in the person's esophagus. If this causes the same symptoms, preventive methods may work. PREVENTION  Avoid hot or cold foods if that seems to be a trigger. PROGNOSIS  This condition does not go away, nor is treatment entirely satisfactory. Patients need to be careful of what they eat. They need to continue on medication if a useful one is found. Fortunately, the condition does not get progressively worse as time passes. Esophageal spasm does not usually lead to more serious problems but sometimes the pain can be disabling. If a person becomes afraid to eat they may become malnourished and lose weight.  TREATMENT   A procedure in which instruments of increasing size are inserted through the esophagus to enlarge (dilate) it are used.   Medications that decrease acid-production of the stomach may be used such as proton-pump inhibitors or H2-blockers.   Medications of several types can be used to relax the muscles of the esophagus.   An individual with a hypersensitive esophagus sometimes improves with low doses of medications  normally used for depression.   No treatment for esophageal spasm is effective for everyone. Often several approaches will be tried before one works. In many cases, the symptoms will improve, but will not go away completely.   For severe cases, relief is obtained two-thirds of the time by cutting the muscles along the entire length of the esophagus. This is a major surgical procedure.   Your symptoms are usually the best guide to how well the treatment for esophageal spasm works.  SIDE EFFECTS OF TREATMENTS  Nitrates can cause headaches and low blood pressure.   Calcium channel blockers can cause:   Feeling sick to your stomach (nausea).   Constipation and other side effects.   Antidepressants can cause side effects that depend on the medication used.  HOME CARE INSTRUCTIONS   Let your caregiver know if problems are getting worse, or if you get food stuck in your esophagus for longer than 1 hour or as directed and are unable to swallow liquid.   Take medications as directed and with permission of your caregiver. Ask about what to do if a medication seems to get stuck in your esophagus. Only take over-the-counter or prescription medicines for pain, discomfort, or fever as directed by your caregiver.   Soft and liquid foods pass more easily than solid pieces.  SEEK IMMEDIATE MEDICAL CARE IF:   You develop severe chest pain, especially if the pain is crushing or pressure-like and spreads to the arms, back, neck, or jaw, or if you have sweating, nausea, or shortness of breath. THIS COULD BE AN EMERGENCY. Do not wait to see if the pain will go away. Get medical help at once. Call 911 or 0 (operator). DO NOT drive yourself to the hospital.   Your chest pain gets worse and does not go away with rest.   You have an attack of chest pain lasting longer than usual despite rest and treatment with the medications your physician has prescribed.   You wake from sleep with chest pain or shortness of  breath.   You feel dizzy or faint.   You have chest pain, not typical of your usual pain, caused by your esophagus for which you originally saw your caregiver.  MAKE SURE YOU:   Understand these instructions.   Will watch your condition.   Will get help right away if you are not doing well or get worse.  Document Released: 03/31/2002 Document Revised: 09/20/2010 Document Reviewed: 10/14/2007 [facility] Patient Information 2012 [place], [place].

## 2011-03-12 NOTE — ED Notes (Signed)
ZOX:WRUE<AV> Expected date:<BR> Expected time:<BR> Means of arrival:<BR> Comments:<BR> dera Hagedorn radiology called pt has appendicitis call surgeon

## 2011-03-12 NOTE — Progress Notes (Signed)
Subjective:    Patient ID: Erica Kramer, female    DOB: [birth year], 56 y.o.   MRN: [id number]  HPI Comments: C/o severe, burning ab discomfort started last pm after consuming pizza and glass of white wine. Could not sleep all night, pain was worse with lying down. Ab discomfort radiates from low ab to upper ab. N and v x 3 started at 4a and somewhat relieved ab discomfort. Was prescribed nexium one year ago and does not take. Did not have bm for 5 days and has long history of constipation, last bm this am. Had colonoscopy last year and has not noticed any blood in stool.   Abdominal Cramping Associated symptoms include constipation and vomiting. Pertinent negatives include no diarrhea. Her past medical history is significant for GERD.  Emesis  Associated symptoms include abdominal pain. Pertinent negatives include no diarrhea.  Gastrophageal Reflux She complains of abdominal pain.      Review of Systems  Constitutional: Negative.   Respiratory: Negative.   Cardiovascular: Negative.   Gastrointestinal: Positive for vomiting, abdominal pain and constipation. Negative for diarrhea, blood in stool, abdominal distention and anal bleeding.  Genitourinary: Negative.   Neurological: Negative.   Psychiatric/Behavioral: The patient is nervous/anxious.    Past Medical History  Diagnosis Date  . Glaucoma   . GERD (gastroesophageal reflux disease)   . Colon polyps   . Diverticulosis, acute   . Anxiety   . Headache   . Rosacea   . DJD (degenerative joint disease)   . Macular degeneration syndrome     History   Social History  . Marital Status: Married    Spouse Name: N/A    Number of Children: N/A  . Years of Education: N/A   Occupational History  . Not on file.   Social History Main Topics  . Smoking status: Never Smoker   . Smokeless tobacco: Never Used  . Alcohol Use: No  . Drug Use: No  . Sexually Active: Yes   Other Topics Concern  . Not on file   Social History  Narrative  . No narrative on file    Past Surgical History  Procedure Date  . Abdominal hysterectomy   . Tonsillectomy   . Colonoscopy   . Polypectomy     Family History  Problem Relation Age of Onset  . Heart disease    . Cancer    . Diabetes    . Breast cancer Mother     No Known Allergies  Current Outpatient Prescriptions on File Prior to Visit  Medication Sig Dispense Refill  . calcium-vitamin D (OSCAL WITH D) 500-200 MG-UNIT per tablet Take 1 tablet by mouth daily.        . ciclopirox (PENLAC) 8 % solution Apply topically at bedtime. Apply over nail and surrounding skin. Apply daily over previous coat. After seven (7) days, may remove with alcohol and continue cycle.  6.6 mL  0  . estradiol (VIVELLE-DOT) 0.0375 MG/24HR Place 1 patch onto the skin. Apply once a week      . latanoprost (XALATAN) 0.005 % ophthalmic solution Place 1 drop into both eyes at bedtime.        Marland Kitchen loratadine (CLARITIN) 10 MG tablet Take 10 mg by mouth daily as needed.       . Multiple Vitamins-Minerals (VISION-VITE PRESERVE PO) Take by mouth. Take 4-5 pills weekly       No current facility-administered medications on file prior to visit.    BP 120/68  Temp(Src)  97.9 F (36.6 C) (Oral)  Wt 132 lb (59.875 kg)chart     Objective:   Physical Exam  Constitutional: She is oriented to person, place, and time. She appears well-developed and well-nourished. No distress.  HENT:  Right Ear: External ear normal.  Left Ear: External ear normal.  Nose: Nose normal.  Mouth/Throat: Oropharynx is clear and moist.  Cardiovascular: Normal rate, regular rhythm, normal heart sounds and intact distal pulses.  Exam reveals no gallop and no friction rub.   No murmur heard. Pulmonary/Chest: Effort normal and breath sounds normal. No respiratory distress. She has no wheezes. She has no rales. She exhibits no tenderness.  Abdominal: Soft. Bowel sounds are normal. She exhibits no distension and no mass. There is  tenderness. There is no rebound and no guarding.  Neurological: She is alert and oriented to person, place, and time.  Skin: Skin is warm and dry. She is not diaphoretic.          Assessment & Plan:  Assessment: Abdominal pain, GERD Plan: Labs: CBC, BMP, amylase, lipase. Nexium, phenergan,vicodin. instructions provided on high fiber and bland diet. Husband will drive patient home and is in lobby. Take otc stool softener as needed while taking vicodin to prevent constipation. Do not drive while taking narcotics or phenergan, may have sedative effect.

## 2011-03-12 NOTE — ED Provider Notes (Signed)
Medical screening examination/treatment/procedure(s) were conducted as a shared visit with non-physician practitioner(s) and myself.  I personally evaluated the patient during the encounter The patient presents with one day of Lower abdominal pain, outside hospital CT results consistent with acute appendicitis.  The patient has mild leukocytosis, and reports prior fever.  Patient is tender on exam.    given these findings surgery was made aware the patient.  The patient was admitted by the surgical service for further evaluation and management.  Gerhard Munch, MD 03/12/11 [phone]

## 2011-03-12 NOTE — ED Notes (Signed)
abd pain seen her pcp had blood work done there done and outpt ct scan done and radiology called to send pt to ed and call surgeon, abd pain since last night at 10pm to rlq radates to umbilicus and up to epigastric area, last food was aat 1500 today 3 crackers, and had tylenol at 1500 also. Fever earlier today none now, 4x emesis today

## 2011-03-12 NOTE — ED Notes (Signed)
Rainbow drawn 1 blue, 1 lavender, 1 light green, 1 dark green, and one pink drawn

## 2011-03-12 NOTE — Telephone Encounter (Signed)
Rx phoned in to pharmacist.

## 2011-03-12 NOTE — Transfer of Care (Signed)
Immediate Anesthesia Transfer of Care Note  Patient: Erica Kramer  Procedure(s) Performed: Procedure(s) (LRB): APPENDECTOMY LAPAROSCOPIC (N/A)  Patient Location: PACU  Anesthesia Type: General  Level of Consciousness: sedated, patient cooperative and responds to stimulaton  Airway & Oxygen Therapy: Patient Spontanous Breathing and Patient connected to face mask oxgen  Post-op Assessment: Report given to PACU RN and Post -op Vital signs reviewed and stable  Post vital signs: Reviewed and stable  Complications: No apparent anesthesia complications

## 2011-03-12 NOTE — ED Provider Notes (Signed)
History     CSN: [id number]  Arrival date & time 03/12/11  1827   First MD Initiated Contact with Patient 03/12/11 1831      Chief Complaint  Patient presents with  . Abdominal Pain  . Nausea    (Consider location/radiation/quality/duration/timing/severity/associated sxs/prior treatment) HPI History provided by pt and prior chart.  Pt reports that she develop diffuse lower abdominal pain, worst in RLQ, yesterday evening.  Pain gradually worsened until it was excruciating.  Associated w/ N/V and this afternoon she spiked a fever of 101.  No aggravating/alleviating factors.  No h/o abd surgeries.  Pt evaluated by her PCP this morning, had labs drawn and a CT abd/pelvis ordered.  CT positive for acute appendicitis and pt referred to ED for treatment.  Per prior chart, labs have resolved and are sig for mild leukocytosis at 13.3.  She last ate 4 crackers at 3:30pm.    Past Medical History  Diagnosis Date  . Glaucoma   . GERD (gastroesophageal reflux disease)   . Colon polyps   . Diverticulosis, acute   . Anxiety   . Headache   . Rosacea   . DJD (degenerative joint disease)   . Macular degeneration syndrome     Past Surgical History  Procedure Date  . Abdominal hysterectomy   . Tonsillectomy   . Colonoscopy   . Polypectomy     Family History  Problem Relation Age of Onset  . Heart disease    . Cancer    . Diabetes    . Breast cancer Mother     History  Substance Use Topics  . Smoking status: Never Smoker   . Smokeless tobacco: Never Used  . Alcohol Use: No    OB History    Grav Para Term Preterm Abortions TAB SAB Ect Mult Living                  Review of Systems  All other systems reviewed and are negative.    Allergies  Review of patient's allergies indicates no known allergies.  Home Medications   Current Outpatient Rx  Name Route Sig Dispense Refill  . CALCIUM CARBONATE-VITAMIN D 500-200 MG-UNIT PO TABS Oral Take 1 tablet by mouth daily.      Marland Kitchen  CICLOPIROX 8 % EX SOLN Topical Apply topically at bedtime. Apply over nail and surrounding skin. Apply daily over previous coat. After seven (7) days, may remove with alcohol and continue cycle. 6.6 mL 0  . ESOMEPRAZOLE MAGNESIUM 40 MG PO CPDR Oral Take 1 capsule (40 mg total) by mouth daily. 30 capsule 3  . ESTRADIOL 0.0375 MG/24HR TD PTTW Transdermal Place 1 patch onto the skin. Apply once a week    . HYDROCODONE-ACETAMINOPHEN 5-500 MG PO TABS Oral Take 1 tablet by mouth every 8 (eight) hours as needed for pain. 20 tablet 0  . LATANOPROST 0.005 % OP SOLN Both Eyes Place 1 drop into both eyes at bedtime.      Marland Kitchen LORATADINE 10 MG PO TABS Oral Take 10 mg by mouth daily as needed.     Marland Kitchen VISION-VITE PRESERVE PO Oral Take by mouth. Take 4-5 pills weekly    . PROMETHAZINE HCL 25 MG PO TABS Oral Take 0.5-1 tablets (12.5-25 mg total) by mouth every 8 (eight) hours as needed for nausea. 20 tablet 0    BP 127/83  Pulse 106  Temp(Src) 97.9 F (36.6 C) (Oral)  Resp 20  SpO2 100%  Physical Exam  Nursing  note and vitals reviewed. Constitutional: She is oriented to person, place, and time. She appears well-developed and well-nourished. No distress.  HENT:  Head: Normocephalic and atraumatic.  Eyes:       Normal appearance  Neck: Normal range of motion.  Cardiovascular: Normal rate and regular rhythm.   Pulmonary/Chest: Effort normal and breath sounds normal.  Abdominal: Soft. Bowel sounds are normal. She exhibits no distension and no mass. There is no guarding.       Diffuse lower abdominal as well as RUQ ttp.  No peritoneal signs.   Neurological: She is alert and oriented to person, place, and time.  Skin: Skin is warm and dry. No rash noted.  Psychiatric: She has a normal mood and affect. Her behavior is normal.    ED Course  Procedures (including critical care time)  Labs Reviewed - No data to display Ct Abdomen Pelvis W Contrast  03/12/2011  *RADIOLOGY REPORT*  Clinical Data: Abdominal  pain, nausea and vomiting, constipation  CT ABDOMEN AND PELVIS WITH CONTRAST  Technique:  Multidetector CT imaging of the abdomen and pelvis was performed following the standard protocol during bolus administration of intravenous contrast.  Contrast: OMNIPAQUE IOHEXOL 300 MG/ML IV SOLN  Comparison: None.  Findings: The lung bases are clear.  The liver enhances with no focal abnormality and no ductal dilatation is seen.  No calcified gallstones are noted.  The pancreas is normal in size and the pancreatic duct is not dilated.  The adrenal glands and spleen are unremarkable.  The stomach is fluid distended with no abnormality noted.  The kidneys enhance with no calculus or mass and no hydronephrosis is seen.  The abdominal aorta is normal in caliber. Only small retroperitoneal nodes are present.  The terminal ileum is unremarkable.  However the appendix is dilated up to 14 mm with some enhancement of the wall consistent with acute appendicitis.  No evidence of perforation is seen currently.  Mild periappendiceal strandiness is noted.  No fluid is noted within the pelvis.  The urinary bladder is decompressed and unremarkable.  The history given is no surgery, but the uterus is not visualized and may have been resected.  No adnexal lesion is seen.  IMPRESSION: Dilated appendix up to 14 mm consistent with acute appendicitis. No evidence of perforation.  This study has remained a call report and will be called to Dr. Orvan Falconer.  Original Report Authenticated By: Juline Patch, M.D.     1. Appendicitis       MDM  Pt referred to ER for acute appendicitis diagnosed via outpatient CT.  Abd pain, N/V and fever onset yesterday evening.  Afebrile on exam (took tylenol at 3pm).  No peritoneal signs.  Labs drawn this am sig for mild leukocytosis.  Pt received IV morphine and zofran.  Dr. Jeraldine Loots will consult GS for admission.          Otilio Miu, [place] 03/12/11 1942

## 2011-03-12 NOTE — Anesthesia Preprocedure Evaluation (Addendum)
Anesthesia Evaluation  Patient identified by MRN, date of birth, ID band Patient awake    Reviewed: Allergy & Precautions, H&P , NPO status , Patient's Chart, lab work & pertinent test results  History of Anesthesia Complications (+) PONV  Airway Mallampati: II TM Distance: >3 FB Neck ROM: Full    Dental No notable dental hx.    Pulmonary neg pulmonary ROS,  clear to auscultation  Pulmonary exam normal       Cardiovascular neg cardio ROS Regular Normal    Neuro/Psych  Headaches, Anxiety    GI/Hepatic Neg liver ROS, GERD-  Medicated,  Endo/Other  Negative Endocrine ROS  Renal/GU negative Renal ROS  Genitourinary negative   Musculoskeletal negative musculoskeletal ROS (+)   Abdominal   Peds negative pediatric ROS (+)  Hematology negative hematology ROS (+)   Anesthesia Other Findings   Reproductive/Obstetrics negative OB ROS                           Anesthesia Physical Anesthesia Plan  ASA: II  Anesthesia Plan: General   Post-op Pain Management:    Induction: Intravenous  Airway Management Planned: Oral ETT  Additional Equipment:   Intra-op Plan:   Post-operative Plan: Extubation in OR  Informed Consent: I have reviewed the patients History and Physical, chart, labs and discussed the procedure including the risks, benefits and alternatives for the proposed anesthesia with the patient or authorized representative who has indicated his/her understanding and acceptance.   Dental advisory given  Plan Discussed with: CRNA  Anesthesia Plan Comments:         Anesthesia Quick Evaluation

## 2011-03-13 ENCOUNTER — Telehealth: Payer: Self-pay | Admitting: Family Medicine

## 2011-03-13 MED ORDER — KCL IN DEXTROSE-NACL 20-5-0.9 MEQ/L-%-% IV SOLN
INTRAVENOUS | Status: DC
  Administered 2011-03-13: 02:00:00 via INTRAVENOUS
  Filled 2011-03-13 (×3): qty 1000

## 2011-03-13 MED ORDER — HYDROCODONE-ACETAMINOPHEN 5-325 MG PO TABS: 1.0000 | ORAL_TABLET | Freq: Four times a day (QID) | ORAL | Status: AC | PRN

## 2011-03-13 MED ORDER — MORPHINE SULFATE 4 MG/ML IJ SOLN: 4.0000 mg | INTRAMUSCULAR | Status: DC | PRN

## 2011-03-13 MED ORDER — ONDANSETRON HCL 4 MG PO TABS: 4.0000 mg | ORAL_TABLET | Freq: Four times a day (QID) | ORAL | Status: DC | PRN

## 2011-03-13 MED ORDER — ONDANSETRON HCL 4 MG/2ML IJ SOLN: 4.0000 mg | Freq: Four times a day (QID) | INTRAMUSCULAR | Status: DC | PRN

## 2011-03-13 NOTE — Telephone Encounter (Signed)
Office Message [address] [place], [place] [address] p. [phone] f. [phone] To: Lacey Jensen Fax: [phone] From: [facility] Date/ Time: 03/12/2011 6:04 PM Taken By: Reita Cliche, CSR Caller: Marisue Ivan Facility: not collected Patient: Erica Kramer, Erica Kramer DOB: March 01, 1955 Phone: [phone] Reason for Call: Marisue Ivan is calling with results for CT ordered by Other. The results are and were read by Dr. Gery Pray. Regarding Appointment: Appt Date: Appt Time: Unknown Provider: Other Reason: Details:

## 2011-03-13 NOTE — Telephone Encounter (Signed)
Radiology called me about CT results (appendicitis), I spoke with the patient, send her to the ER, spoke w/the charge nurse at [facility] and discuss case w/ her JP

## 2011-03-13 NOTE — Progress Notes (Signed)
1 Day Post-Op  Subjective: No complaints  Objective: Vital signs in last 24 hours: Temp:  [97.7 F (36.5 C)-102.5 F (39.2 C)] 97.7 F (36.5 C) (02/19 0634) Pulse Rate:  [78-106] 78  (02/19 0634) Resp:  [18-20] 18  (02/19 0634) BP: (96-130)/(57-83) 105/65 mmHg (02/19 0634) SpO2:  [97 %-100 %] 98 % (02/19 0634) Weight:  [132 lb (59.875 kg)] 132 lb (59.875 kg) (02/19 0024) Last BM Date: 03/12/11  Intake/Output from previous day: 02/18 0701 - 02/19 0700 In: 1026 [I.V.:976; IV Piggyback:50] Out: 220 [Urine:220] Intake/Output this shift:    GI: incisions clean   Lab Results:   Basename 03/12/11 1011  WBC 13.3*  HGB 13.9  HCT 40.5  PLT 174.0   BMET  Basename 03/12/11 1011  NA 140  K 3.7  CL 105  CO2 27  GLUCOSE 128*  BUN 15  CREATININE 0.8  CALCIUM 9.1   PT/INR No results found for this basename: LABPROT:2,INR:2 in the last 72 hours ABG No results found for this basename: PHART:2,PCO2:2,PO2:2,HCO3:2 in the last 72 hours  Studies/Results: Ct Abdomen Pelvis W Contrast  03/12/2011  *RADIOLOGY REPORT*  Clinical Data: Abdominal pain, nausea and vomiting, constipation  CT ABDOMEN AND PELVIS WITH CONTRAST  Technique:  Multidetector CT imaging of the abdomen and pelvis was performed following the standard protocol during bolus administration of intravenous contrast.  Contrast: OMNIPAQUE IOHEXOL 300 MG/ML IV SOLN  Comparison: None.  Findings: The lung bases are clear.  The liver enhances with no focal abnormality and no ductal dilatation is seen.  No calcified gallstones are noted.  The pancreas is normal in size and the pancreatic duct is not dilated.  The adrenal glands and spleen are unremarkable.  The stomach is fluid distended with no abnormality noted.  The kidneys enhance with no calculus or mass and no hydronephrosis is seen.  The abdominal aorta is normal in caliber. Only small retroperitoneal nodes are present.  The terminal ileum is unremarkable.  However the  appendix is dilated up to 14 mm with some enhancement of the wall consistent with acute appendicitis.  No evidence of perforation is seen currently.  Mild periappendiceal strandiness is noted.  No fluid is noted within the pelvis.  The urinary bladder is decompressed and unremarkable.  The history given is no surgery, but the uterus is not visualized and may have been resected.  No adnexal lesion is seen.  IMPRESSION: Dilated appendix up to 14 mm consistent with acute appendicitis. No evidence of perforation.  This study has remained a call report and will be called to Dr. Orvan Falconer.  Original Report Authenticated By: Juline Patch, M.D.    Anti-infectives: Anti-infectives     Start     Dose/Rate Route Frequency Ordered Stop   03/12/11 2230   ertapenem (INVANZ) 1 g in sodium chloride 0.9 % 50 mL IVPB        1 g 100 mL/hr over 30 Minutes Intravenous Every 24 hours 03/12/11 2202            Assessment/Plan: POD 1 lap appy Plan dc home if does well with lunch   LOS: 1 day    [facility] 03/13/2011

## 2011-03-13 NOTE — Telephone Encounter (Signed)
Dr jenkins is aware 

## 2011-03-13 NOTE — Progress Notes (Signed)
Pt has been educated regarding correct use of Incentive Spirometer (IS). Pt verbalizes understanding of correct use. Pt states that there is no further questions regarding correct use of the IS. Zhuri Krass Lorraine Khole Arterburn, RN    

## 2011-03-13 NOTE — Discharge Instructions (Signed)
[facility] ______[facility] °LAPAROSCOPIC SURGERY: POST OP INSTRUCTIONS °Always review your discharge instruction sheet given to you by the facility where your surgery was performed. °IF YOU HAVE DISABILITY OR FAMILY LEAVE FORMS, YOU MUST BRING THEM TO THE OFFICE FOR PROCESSING.   °DO NOT GIVE THEM TO YOUR DOCTOR. ° °1. A prescription for pain medication may be given to you upon discharge.  Take your pain medication as prescribed, if needed.  If narcotic pain medicine is not needed, then you may take acetaminophen (Tylenol) or ibuprofen (Advil) as needed. °2. Take your usually prescribed medications unless otherwise directed. °3. If you need a refill on your pain medication, please contact your pharmacy.  They will contact our office to request authorization. Prescriptions will not be filled after 5pm or on week-ends. °4. You should follow a light diet the first few days after arrival home, such as soup and crackers, etc.  Be sure to include lots of fluids daily. °5. Most patients will experience some swelling and bruising in the area of the incisions.  Ice packs will help.  Swelling and bruising can take several days to resolve.  °6. It is common to experience some constipation if taking pain medication after surgery.  Increasing fluid intake and taking a stool softener (such as Colace) will usually help or prevent this problem from occurring.  A mild laxative (Milk of Magnesia or Miralax) should be taken according to package instructions if there are no bowel movements after 48 hours. °7. Unless discharge instructions indicate otherwise, you may remove your bandages 24-48 hours after surgery, and you may shower at that time.  You may have steri-strips (small skin tapes) in place directly over the incision.  These strips should be left on the skin for 7-10 days.  If your surgeon used skin glue on the incision, you may shower in 24 hours.  The glue will flake off over the next 2-3 weeks.  Any sutures or  staples will be removed at the office during your follow-up visit. °8. ACTIVITIES:  You may resume regular (light) daily activities beginning the next day--such as daily self-care, walking, climbing stairs--gradually increasing activities as tolerated.  You may have sexual intercourse when it is comfortable.  Refrain from any heavy lifting or straining until approved by your doctor. °a. You may drive when you are no longer taking prescription pain medication, you can comfortably wear a seatbelt, and you can safely maneuver your car and apply brakes. °b. RETURN TO WORK:  __________________________________________________________ °9. You should see your doctor in the office for a follow-up appointment approximately 2-3 weeks after your surgery.  Make sure that you call for this appointment within a day or two after you arrive home to insure a convenient appointment time. °10. OTHER INSTRUCTIONS: __________________________________________________________________________________________________________________________ __________________________________________________________________________________________________________________________ °WHEN TO CALL YOUR DOCTOR: °1. Fever over 101.0 °2. Inability to urinate °3. Continued bleeding from incision. °4. Increased pain, redness, or drainage from the incision. °5. Increasing abdominal pain ° °The clinic staff is available to answer your questions during regular business hours.  Please don’t hesitate to call and ask to speak to one of the nurses for clinical concerns.  If you have a medical emergency, go to the nearest emergency room or call 911.  A surgeon from [facility] is always on call at the hospital. °[address], [address], [place], [place]  [address] ? [address] [address], [place], [place]   [address] °[phone] ? [phone] ? FAX [phone] °Web site:   [email or web address] °

## 2011-03-13 NOTE — Anesthesia Postprocedure Evaluation (Signed)
  Anesthesia Post-op Note  Patient: Erica Kramer  Procedure(s) Performed: Procedure(s) (LRB): APPENDECTOMY LAPAROSCOPIC (N/A)  Patient Location: PACU  Anesthesia Type: General  Level of Consciousness: awake and alert   Airway and Oxygen Therapy: Patient Spontanous Breathing  Post-op Pain: mild  Post-op Assessment: Post-op Vital signs reviewed, Patient's Cardiovascular Status Stable, Respiratory Function Stable, Patent Airway and No signs of Nausea or vomiting  Post-op Vital Signs: stable  Complications: No apparent anesthesia complications

## 2011-03-13 NOTE — Progress Notes (Signed)
Pt has been d/c to the home. Pt and pt's family verbalize understanding of all d/c instructions and state there are no further questions. Pt taken to car via wheelchair.Tikia Skilton Lorraine Mckinnley Cottier, RN    

## 2011-03-13 NOTE — Discharge Summary (Signed)
Agree with above 

## 2011-03-13 NOTE — Discharge Summary (Signed)
  Patient ID: GABRIELA IRIGOYEN MRN: [id number] DOB/AGE: 1956/01/16 56 y.o.  Admit date: 03/12/2011 Discharge date: 03/13/2011  Procedures:  Laparoscopic appendectomy  Consults: None  Reason for Admission:  This is a 56 yo female who presented to the [facility] secondary to abdominal pain.  She had a CT scan that revealed acute appendicitis.  Admission Diagnoses: 1. Acute appendicitis  Hospital Course: The patient was admitted and given a dose of IV Invanz.  She was taken to the operating room where she underwent a lap appy.  She tolerated the procedure well.  On POD# 1, her diet was advanced and she was tolerating po pain meds.  She was otherwise felt stable for discharge home.  Discharge Diagnoses:  1. Acute appendicitis, s/p lap appy  Discharge Medications: Medication List  As of 03/13/2011  8:03 AM   STOP taking these medications         HYDROcodone-acetaminophen 5-500 MG per tablet         TAKE these medications         calcium-vitamin D 500-200 MG-UNIT per tablet   Commonly known as: OSCAL WITH D   Take 1 tablet by mouth daily.      esomeprazole 40 MG capsule   Commonly known as: NEXIUM   Take 1 capsule (40 mg total) by mouth daily.      estradiol 0.0375 MG/24HR   Commonly known as: VIVELLE-DOT   Place 1 patch onto the skin once a week. Apply once a week      HYDROcodone-acetaminophen 5-325 MG per tablet   Commonly known as: NORCO   Take 1 tablet by mouth every 6 (six) hours as needed for pain.      latanoprost 0.005 % ophthalmic solution   Commonly known as: XALATAN   Place 1 drop into both eyes at bedtime.      loratadine 10 MG tablet   Commonly known as: CLARITIN   Take 10 mg by mouth daily as needed.      promethazine 25 MG tablet   Commonly known as: PHENERGAN   Take 0.5-1 tablets (12.5-25 mg total) by mouth every 8 (eight) hours as needed for nausea.      VISION-VITE PRESERVE PO   Take by mouth. Take 4-5 pills weekly            Discharge  Instructions: Follow-up Information    Follow up with Carrie Mew, MD. (As needed)       Follow up with [facility] on 03/27/2011. (2:20pm, arrive at 2:00pm)    Contact information:   [facility] [phone]         Signed: Letha Cape 03/13/2011, 8:03 AM

## 2011-03-16 ENCOUNTER — Ambulatory Visit: Payer: BC Managed Care – PPO

## 2011-03-16 ENCOUNTER — Telehealth: Payer: Self-pay | Admitting: Family

## 2011-03-16 NOTE — Telephone Encounter (Signed)
Spoke with pt. She wanted to notify office that she will follow up with the surgeon

## 2011-03-16 NOTE — Telephone Encounter (Signed)
Pt is returning Erica Kramer call °

## 2011-03-26 ENCOUNTER — Ambulatory Visit
Admission: RE | Admit: 2011-03-26 | Discharge: 2011-03-26 | Disposition: A | Discharge status: Home / Self Care | Payer: BC Managed Care – PPO | Source: Ambulatory Visit | Attending: Obstetrics and Gynecology | Admitting: Obstetrics and Gynecology

## 2011-03-26 ENCOUNTER — Encounter (HOSPITAL_COMMUNITY): Payer: Self-pay | Admitting: General Surgery

## 2011-03-26 DIAGNOSIS — Z1231 Encounter for screening mammogram for malignant neoplasm of breast: Secondary | ICD-10-CM

## 2011-03-27 ENCOUNTER — Ambulatory Visit (INDEPENDENT_AMBULATORY_CARE_PROVIDER_SITE_OTHER): Payer: BC Managed Care – PPO | Admitting: Radiology

## 2011-03-27 ENCOUNTER — Encounter (INDEPENDENT_AMBULATORY_CARE_PROVIDER_SITE_OTHER): Payer: Self-pay | Admitting: Radiology

## 2011-03-27 DIAGNOSIS — Z9889 Other specified postprocedural states: Secondary | ICD-10-CM

## 2011-03-27 DIAGNOSIS — K37 Unspecified appendicitis: Secondary | ICD-10-CM

## 2011-03-27 DIAGNOSIS — Z9049 Acquired absence of other specified parts of digestive tract: Secondary | ICD-10-CM

## 2011-03-27 NOTE — Progress Notes (Signed)
Erica Kramer 12-Dec-1955 [id number] 03/27/2011   History of Present Illness: Erica Kramer is a  56 y.o. female who presents today status post lap appy on 2/18 by Dr. Carolynne Edouard.  Pathology reveals appendicitis.  The patient is tolerating a regular diet, having normal bowel movements, has good pain control.  She  is back to most normal activities.   Physical Exam: Abd: soft, nontender, active bowel sounds, nondistended.  All incisions are well healed.  Impression: 1.  Acute appendicitis, s/p lap appy  Plan: She  is able to return to normal activities. She  may follow up on a prn basis.   Marianna Fuss 03/27/2011 2:39 PM

## 2011-04-16 ENCOUNTER — Encounter (HOSPITAL_COMMUNITY): Payer: Self-pay

## 2011-08-10 ENCOUNTER — Other Ambulatory Visit: Payer: Self-pay | Admitting: Internal Medicine

## 2011-11-13 ENCOUNTER — Ambulatory Visit (INDEPENDENT_AMBULATORY_CARE_PROVIDER_SITE_OTHER): Payer: BC Managed Care – PPO

## 2011-11-13 DIAGNOSIS — Z23 Encounter for immunization: Secondary | ICD-10-CM

## 2011-12-05 ENCOUNTER — Telehealth: Payer: Self-pay | Admitting: Internal Medicine

## 2011-12-05 NOTE — Telephone Encounter (Signed)
noted 

## 2011-12-05 NOTE — Telephone Encounter (Signed)
Caller: Saysha/Patient; Patient Name: Erica Kramer; PCP: Darryll Capers (Adults only); Best Callback Phone Number: [phone]; Reason for call: Patient calling about blood work prior to her annual exam.  States she has always had trouble with her retina on her eye exam, and the opthalmologist always asks if there is any history of diabetes.  States her labwork has always been normal in terms of glucoses.  Patient does not want to come to office specifically for "a wild goose chase  if there is nothing there," but her opthalmologist was "pretty insistent" there may be something which needed to be investigated further.  Per Epic, last BMP 02/13 showed glucose of 128.  States she has noticed more often having to urinate, but states other women her age complain of this as well, but never considered diabetes.  Denies symptoms currently; advised appt within 24 hours.  Appt scheduled with Dr. Lovell Sheehan 12/06/11 1145; patient advised to be NPO in case labwork will be drawn.

## 2011-12-06 ENCOUNTER — Encounter: Payer: Self-pay | Admitting: Internal Medicine

## 2011-12-06 ENCOUNTER — Ambulatory Visit (INDEPENDENT_AMBULATORY_CARE_PROVIDER_SITE_OTHER): Payer: BC Managed Care – PPO | Admitting: Internal Medicine

## 2011-12-06 VITALS — BP 116/72 | HR 72 | Temp 98.6°F | Resp 16 | Ht 64.5 in | Wt 137.0 lb

## 2011-12-06 DIAGNOSIS — R739 Hyperglycemia, unspecified: Secondary | ICD-10-CM

## 2011-12-06 DIAGNOSIS — H3500 Unspecified background retinopathy: Secondary | ICD-10-CM

## 2011-12-06 DIAGNOSIS — R7309 Other abnormal glucose: Secondary | ICD-10-CM

## 2011-12-06 LAB — BASIC METABOLIC PANEL
BUN: 12 mg/dL (ref 6–23)
CO2: 28 mEq/L (ref 19–32)
Calcium: 8.7 mg/dL (ref 8.4–10.5)
Chloride: 101 mEq/L (ref 96–112)
Creatinine, Ser: 0.7 mg/dL (ref 0.4–1.2)
GFR: 89.79 mL/min (ref >60.00)
Glucose, Bld: 85 mg/dL (ref 70–99)
Potassium: 4 mEq/L (ref 3.5–5.1)
Sodium: 138 mEq/L (ref 135–145)

## 2011-12-06 LAB — HEMOGLOBIN A1C: Hgb A1c MFr Bld: 5.7 % (ref 4.6–6.5)

## 2012-01-31 ENCOUNTER — Other Ambulatory Visit: Payer: Self-pay | Admitting: Obstetrics and Gynecology

## 2012-01-31 DIAGNOSIS — Z1231 Encounter for screening mammogram for malignant neoplasm of breast: Secondary | ICD-10-CM

## 2012-01-31 DIAGNOSIS — Z780 Asymptomatic menopausal state: Secondary | ICD-10-CM

## 2012-02-04 NOTE — Progress Notes (Signed)
Subjective:    Patient ID: Erica Kramer, female    DOB: August 30, 1955, 57 y.o.   MRN: [id number]  HPI  Patient self referred to discuss visit with ophtamologist. She had early signs of glaucoma and a small retinal hemorrhage was noted. She was asked to be screened or DM. We reviewed her PMHx and all recent labs and noted that 10 months ago there was a single eleavter blood glucose on a non fasting lab. She has no other signs or symptoms of DM  Review of Systems  Constitutional: Negative for activity change, appetite change and fatigue.  HENT: Negative for ear pain, congestion, neck pain, postnasal drip and sinus pressure.   Eyes: Negative for redness and visual disturbance.  Respiratory: Negative for cough, shortness of breath and wheezing.   Gastrointestinal: Negative for abdominal pain and abdominal distention.  Genitourinary: Negative for dysuria, frequency and menstrual problem.  Musculoskeletal: Negative for myalgias, joint swelling and arthralgias.  Skin: Negative for rash and wound.  Neurological: Negative for dizziness, weakness and headaches.  Hematological: Negative for adenopathy. Does not bruise/bleed easily.  Psychiatric/Behavioral: Negative for sleep disturbance and decreased concentration.   Past Medical History  Diagnosis Date  . Glaucoma(365)   . GERD (gastroesophageal reflux disease)   . Colon polyps   . Diverticulosis, acute   . Anxiety   . Headache   . Rosacea   . DJD (degenerative joint disease)   . Macular degeneration syndrome   . PONV (postoperative nausea and vomiting)     History   Social History  . Marital Status: Married    Spouse Name: N/A    Number of Children: N/A  . Years of Education: N/A   Occupational History  . Not on file.   Social History Main Topics  . Smoking status: Never Smoker   . Smokeless tobacco: Never Used  . Alcohol Use: Yes     Comment: occasional wine  . Drug Use: No  . Sexually Active: Yes   Other Topics Concern    . Not on file   Social History Narrative  . No narrative on file    Past Surgical History  Procedure Date  . Abdominal hysterectomy   . Tonsillectomy   . Colonoscopy   . Polypectomy   . Laparoscopic appendectomy 03/12/2011    Procedure: APPENDECTOMY LAPAROSCOPIC;  Surgeon: Robyne Askew, MD;  Location: [facility];  Service: General;  Laterality: N/A;    Family History  Problem Relation Age of Onset  . Heart disease    . Cancer    . Diabetes    . Breast cancer Mother     No Known Allergies  Current Outpatient Prescriptions on File Prior to Visit  Medication Sig Dispense Refill  . calcium-vitamin D (OSCAL WITH D) 500-200 MG-UNIT per tablet Take 1 tablet by mouth daily. Takes on and off      . ciclopirox (PENLAC) 8 % solution USE ON NAIL & SURROUNDING SKIN AT BED.USE DAILY OVER PREVIOUS COAT X7DAYS. REMOVE W/ALCOHOL. RESTART  6.6 mL  0  . esomeprazole (NEXIUM) 40 MG capsule Take 40 mg by mouth daily as needed.      Marland Kitchen estradiol (VIVELLE-DOT) 0.0375 MG/24HR Place 1 patch onto the skin once a week. Apply once a week      . latanoprost (XALATAN) 0.005 % ophthalmic solution Place 1 drop into both eyes at bedtime.        Marland Kitchen loratadine (CLARITIN) 10 MG tablet Take 10 mg by mouth daily  as needed.         BP 116/72  Pulse 72  Temp 98.6 F (37 C)  Resp 16  Ht 5' 4.5" (1.638 m)  Wt 137 lb (62.143 kg)  BMI 23.15 kg/m2        Objective:   Physical Exam  Nursing note and vitals reviewed. Constitutional: She is oriented to person, place, and time. She appears well-developed and well-nourished. No distress.  HENT:  Head: Normocephalic and atraumatic.  Right Ear: External ear normal.  Left Ear: External ear normal.  Nose: Nose normal.  Mouth/Throat: Oropharynx is clear and moist.  Eyes: Conjunctivae normal and EOM are normal. Pupils are equal, round, and reactive to light.  Neck: Normal range of motion. Neck supple. No JVD present. No tracheal deviation present. No thyromegaly  present.  Cardiovascular: Normal rate, regular rhythm, normal heart sounds and intact distal pulses.   No murmur heard. Pulmonary/Chest: Effort normal and breath sounds normal. She has no wheezes. She exhibits no tenderness.  Abdominal: Soft. Bowel sounds are normal.  Musculoskeletal: Normal range of motion. She exhibits no edema and no tenderness.  Lymphadenopathy:    She has no cervical adenopathy.  Neurological: She is alert and oriented to person, place, and time. She has normal reflexes. No cranial nerve deficit.  Skin: Skin is warm and dry. She is not diaphoretic.  Psychiatric: She has a normal mood and affect. Her behavior is normal.          Assessment & Plan:  DISCUSSED THE SIGNS AND RISKS FOR DM  I have spent more than 30 minutes examining this patient face-to-face of which over half was spent in counseling I will order and review an A1C I do not suspect AODM Documented the glacoma

## 2012-02-04 NOTE — Patient Instructions (Signed)
The patient is instructed to continue all medications as prescribed. Schedule followup with check out clerk upon leaving the clinic  

## 2012-02-11 ENCOUNTER — Other Ambulatory Visit (INDEPENDENT_AMBULATORY_CARE_PROVIDER_SITE_OTHER): Payer: BC Managed Care – PPO

## 2012-02-11 DIAGNOSIS — Z0000 Encounter for general adult medical examination without abnormal findings: Secondary | ICD-10-CM

## 2012-02-11 LAB — LIPID PANEL
Cholesterol: 235 mg/dL — ABNORMAL HIGH (ref 0–200)
HDL: 72.4 mg/dL (ref >39.00)
Total CHOL/HDL Ratio: 3
Triglycerides: 108 mg/dL (ref 0.0–149.0)
VLDL: 21.6 mg/dL (ref 0.0–40.0)

## 2012-02-11 LAB — BASIC METABOLIC PANEL
BUN: 15 mg/dL (ref 6–23)
CO2: 28 mEq/L (ref 19–32)
Calcium: 9 mg/dL (ref 8.4–10.5)
Chloride: 102 mEq/L (ref 96–112)
Creatinine, Ser: 0.8 mg/dL (ref 0.4–1.2)
GFR: 79.33 mL/min (ref >60.00)
Glucose, Bld: 84 mg/dL (ref 70–99)
Potassium: 3.4 mEq/L — ABNORMAL LOW (ref 3.5–5.1)
Sodium: 138 mEq/L (ref 135–145)

## 2012-02-11 LAB — CBC WITH DIFFERENTIAL/PLATELET
Basophils Absolute: 0 10*3/uL (ref 0.0–0.1)
Basophils Relative: 0.5 % (ref 0.0–3.0)
Eosinophils Absolute: 0.1 10*3/uL (ref 0.0–0.7)
Eosinophils Relative: 3 % (ref 0.0–5.0)
HCT: 40.7 % (ref 36.0–46.0)
Hemoglobin: 13.8 g/dL (ref 12.0–15.0)
Lymphocytes Relative: 32.7 % (ref 12.0–46.0)
Lymphs Abs: 1.5 10*3/uL (ref 0.7–4.0)
MCHC: 33.9 g/dL (ref 30.0–36.0)
MCV: 94 fl (ref 78.0–100.0)
Monocytes Absolute: 0.4 10*3/uL (ref 0.1–1.0)
Monocytes Relative: 9.1 % (ref 3.0–12.0)
Neutro Abs: 2.4 10*3/uL (ref 1.4–7.7)
Neutrophils Relative %: 54.7 % (ref 43.0–77.0)
Platelets: 197 10*3/uL (ref 150.0–400.0)
RBC: 4.34 Mil/uL (ref 3.87–5.11)
RDW: 12.7 % (ref 11.5–14.6)
WBC: 4.4 10*3/uL — ABNORMAL LOW (ref 4.5–10.5)

## 2012-02-11 LAB — POCT URINALYSIS DIPSTICK
Bilirubin, UA: NEGATIVE
Blood, UA: NEGATIVE
Glucose, UA: NEGATIVE
Ketones, UA: NEGATIVE
Leukocytes, UA: NEGATIVE
Nitrite, UA: NEGATIVE
Protein, UA: NEGATIVE
Spec Grav, UA: 1.02
Urobilinogen, UA: 0.2
pH, UA: 6

## 2012-02-11 LAB — LDL CHOLESTEROL, DIRECT: Direct LDL: 130.5 mg/dL

## 2012-02-11 LAB — HEPATIC FUNCTION PANEL
ALT: 32 U/L (ref 0–35)
AST: 24 U/L (ref 0–37)
Albumin: 4.1 g/dL (ref 3.5–5.2)
Alkaline Phosphatase: 74 U/L (ref 39–117)
Bilirubin, Direct: 0 mg/dL (ref 0.0–0.3)
Total Bilirubin: 0.7 mg/dL (ref 0.3–1.2)
Total Protein: 7.1 g/dL (ref 6.0–8.3)

## 2012-02-11 LAB — TSH: TSH: 1.77 u[IU]/mL (ref 0.35–5.50)

## 2012-02-18 ENCOUNTER — Encounter: Payer: Self-pay | Admitting: Internal Medicine

## 2012-02-18 ENCOUNTER — Ambulatory Visit (INDEPENDENT_AMBULATORY_CARE_PROVIDER_SITE_OTHER): Payer: BC Managed Care – PPO | Admitting: Internal Medicine

## 2012-02-18 VITALS — BP 120/78 | HR 72 | Temp 98.1°F | Resp 16 | Ht 64.5 in | Wt 138.0 lb

## 2012-02-18 DIAGNOSIS — E876 Hypokalemia: Secondary | ICD-10-CM

## 2012-02-18 DIAGNOSIS — Z0000 Encounter for general adult medical examination without abnormal findings: Secondary | ICD-10-CM

## 2012-02-18 NOTE — Progress Notes (Signed)
Subjective:    Patient ID: Henderson Cloud, female    DOB: 09/12/1955, 57 y.o.   MRN: [id number]  HPI wellness exam   Review of Systems  Constitutional: Negative for activity change, appetite change and fatigue.  HENT: Negative for ear pain, congestion, neck pain, postnasal drip and sinus pressure.   Eyes: Negative for redness and visual disturbance.  Respiratory: Negative for cough, shortness of breath and wheezing.   Gastrointestinal: Negative for abdominal pain and abdominal distention.  Genitourinary: Negative for dysuria, frequency and menstrual problem.  Musculoskeletal: Negative for myalgias, joint swelling and arthralgias.  Skin: Negative for rash and wound.  Neurological: Negative for dizziness, weakness and headaches.  Hematological: Negative for adenopathy. Does not bruise/bleed easily.  Psychiatric/Behavioral: Negative for sleep disturbance and decreased concentration.  All other systems reviewed and are negative.   Past Medical History  Diagnosis Date  . Glaucoma(365)   . GERD (gastroesophageal reflux disease)   . Colon polyps   . Diverticulosis, acute   . Anxiety   . Headache   . Rosacea   . DJD (degenerative joint disease)   . Macular degeneration syndrome   . PONV (postoperative nausea and vomiting)     History   Social History  . Marital Status: Married    Spouse Name: N/A    Number of Children: N/A  . Years of Education: N/A   Occupational History  . Not on file.   Social History Main Topics  . Smoking status: Never Smoker   . Smokeless tobacco: Never Used  . Alcohol Use: Yes     Comment: occasional wine  . Drug Use: No  . Sexually Active: Yes   Other Topics Concern  . Not on file   Social History Narrative  . No narrative on file    Past Surgical History  Procedure Date  . Abdominal hysterectomy   . Tonsillectomy   . Colonoscopy   . Polypectomy   . Laparoscopic appendectomy 03/12/2011    Procedure: APPENDECTOMY LAPAROSCOPIC;   Surgeon: Robyne Askew, MD;  Location: [facility];  Service: General;  Laterality: N/A;    Family History  Problem Relation Age of Onset  . Heart disease    . Cancer    . Diabetes    . Breast cancer Mother     No Known Allergies  Current Outpatient Prescriptions on File Prior to Visit  Medication Sig Dispense Refill  . calcium-vitamin D (OSCAL WITH D) 500-200 MG-UNIT per tablet Take 1 tablet by mouth daily. Takes on and off      . ciclopirox (PENLAC) 8 % solution USE ON NAIL & SURROUNDING SKIN AT BED.USE DAILY OVER PREVIOUS COAT X7DAYS. REMOVE W/ALCOHOL. RESTART  6.6 mL  0  . esomeprazole (NEXIUM) 40 MG capsule Take 40 mg by mouth daily as needed.      Marland Kitchen estradiol (VIVELLE-DOT) 0.0375 MG/24HR Place 1 patch onto the skin once a week. Apply once a week      . latanoprost (XALATAN) 0.005 % ophthalmic solution Place 1 drop into both eyes at bedtime.        Marland Kitchen loratadine (CLARITIN) 10 MG tablet Take 10 mg by mouth daily as needed.         BP 120/78  Pulse 72  Temp 98.1 F (36.7 C)  Resp 16  Ht 5' 4.5" (1.638 m)  Wt 138 lb (62.596 kg)  BMI 23.32 kg/m2        Objective:   Physical Exam  Nursing note  and vitals reviewed. Constitutional: She is oriented to person, place, and time. She appears well-developed and well-nourished. No distress.  HENT:  Head: Normocephalic and atraumatic.  Right Ear: External ear normal.  Left Ear: External ear normal.  Nose: Nose normal.  Mouth/Throat: Oropharynx is clear and moist.  Eyes: Conjunctivae normal and EOM are normal. Pupils are equal, round, and reactive to light.  Neck: Normal range of motion. Neck supple. No JVD present. No tracheal deviation present. No thyromegaly present.  Cardiovascular: Normal rate, regular rhythm, normal heart sounds and intact distal pulses.   No murmur heard. Pulmonary/Chest: Effort normal and breath sounds normal. She has no wheezes. She exhibits no tenderness.  Abdominal: Soft. Bowel sounds are normal.    Musculoskeletal: Normal range of motion. She exhibits no edema and no tenderness.  Lymphadenopathy:    She has no cervical adenopathy.  Neurological: She is alert and oriented to person, place, and time. She has normal reflexes. No cranial nerve deficit.  Skin: Skin is warm and dry. She is not diaphoretic.  Psychiatric: She has a normal mood and affect. Her behavior is normal.          Assessment & Plan:   Patient presents for yearly preventative medicine examination.    This is a routine physical examination for this healthy  Female. Reviewed all health maintenance protocols including mammography colonoscopy bone density and reviewed appropriate screening labs. Her immunization history was reviewed as well as her current medications and allergies refills of her chronic medications were given and the plan for yearly health maintenance was discussed all orders and referrals were made as appropriate.

## 2012-02-18 NOTE — Patient Instructions (Signed)
The patient is instructed to continue all medications as prescribed. Schedule followup with check out clerk upon leaving the clinic  

## 2012-02-19 LAB — BASIC METABOLIC PANEL
BUN: 13 mg/dL (ref 6–23)
CO2: 30 mEq/L (ref 19–32)
Calcium: 9.3 mg/dL (ref 8.4–10.5)
Chloride: 104 mEq/L (ref 96–112)
Creatinine, Ser: 0.8 mg/dL (ref 0.4–1.2)
GFR: 81.7 mL/min (ref >60.00)
Glucose, Bld: 94 mg/dL (ref 70–99)
Potassium: 3.9 mEq/L (ref 3.5–5.1)
Sodium: 139 mEq/L (ref 135–145)

## 2012-03-21 ENCOUNTER — Other Ambulatory Visit: Payer: Self-pay | Admitting: Internal Medicine

## 2012-03-31 ENCOUNTER — Ambulatory Visit
Admission: RE | Admit: 2012-03-31 | Discharge: 2012-03-31 | Disposition: A | Discharge status: Home / Self Care | Payer: BC Managed Care – PPO | Source: Ambulatory Visit | Attending: Obstetrics and Gynecology | Admitting: Obstetrics and Gynecology

## 2012-03-31 DIAGNOSIS — Z780 Asymptomatic menopausal state: Secondary | ICD-10-CM

## 2012-03-31 DIAGNOSIS — Z1231 Encounter for screening mammogram for malignant neoplasm of breast: Secondary | ICD-10-CM

## 2012-11-27 ENCOUNTER — Other Ambulatory Visit: Payer: Self-pay

## 2012-12-05 ENCOUNTER — Ambulatory Visit (INDEPENDENT_AMBULATORY_CARE_PROVIDER_SITE_OTHER): Payer: BC Managed Care – PPO | Admitting: Family Medicine

## 2012-12-05 ENCOUNTER — Encounter: Payer: Self-pay | Admitting: Family Medicine

## 2012-12-05 ENCOUNTER — Telehealth: Payer: Self-pay | Admitting: Internal Medicine

## 2012-12-05 VITALS — BP 120/90 | Temp 97.7°F | Wt 142.0 lb

## 2012-12-05 DIAGNOSIS — M542 Cervicalgia: Secondary | ICD-10-CM

## 2012-12-05 DIAGNOSIS — M62838 Other muscle spasm: Secondary | ICD-10-CM

## 2012-12-05 DIAGNOSIS — S0300XA Dislocation of jaw, unspecified side, initial encounter: Secondary | ICD-10-CM

## 2012-12-05 MED ORDER — CYCLOBENZAPRINE HCL 5 MG PO TABS: 5.0000 mg | ORAL_TABLET | Freq: Three times a day (TID) | ORAL | Status: DC | PRN

## 2012-12-05 MED ORDER — NAPROXEN 500 MG PO TABS: 500.0000 mg | ORAL_TABLET | Freq: Two times a day (BID) | ORAL | Status: DC

## 2012-12-05 NOTE — Telephone Encounter (Addendum)
flexeril 5 mg tid #20/0 refills per Dr Lovell Sheehan

## 2012-12-05 NOTE — Patient Instructions (Signed)
-  you can take naproxen (per instructions) or tylenol (1000mg  3 times daily)  -do exercises  -heat 15 minutes several times per day  -capsacin or menthol (tiger balm is one example) containing sports creams  -follow up in 3-4 weeks if not better

## 2012-12-05 NOTE — Telephone Encounter (Signed)
Patient calling and states was seen in office 11-14 due to neck pain. MD had offered "muscle relaxer" but patient declined as did not want to feel drowsy. Has decided would like to have muscle relaxer to try over the week end. Uses [facility]. Please call and advise MD has called to pharmacy. Patient phone number/[phone]. [place]

## 2012-12-05 NOTE — Progress Notes (Signed)
Pre visit review using our clinic review tool, if applicable. No additional management support is needed unless otherwise documented below in the visit note. 

## 2012-12-05 NOTE — Progress Notes (Signed)
Chief Complaint  Patient presents with  . Neck Pain    HPI:  Erica Kramer is a 57 yo patient of Dr. Lovell Sheehan with a hx of DDD here for an acute visit for:  1)Muscle Spasm: -long hx of neck problem addressed by her PCP per her report -reports PCP gave her celebrex in the past -did neck exercises and did better, but then when stopped neck exercises got worse -often wakes up with a stiff and painful next as trigger for flares -had flare recently that started 1 week ago after sleeping on sofa - might be related to clenching teeth at night, has TMJ -restarted exercises yesterday -reports pain is similar to prior episodes in R neck and radiates to shoulder and arm -denies: fevers, malaise, weakness, numbness -she prefers to avoid any medications that can make her tired   ROS: See pertinent positives and negatives per HPI.  Past Medical History  Diagnosis Date  . Glaucoma   . GERD (gastroesophageal reflux disease)   . Colon polyps   . Diverticulosis, acute   . Anxiety   . Headache(784.0)   . Rosacea   . DJD (degenerative joint disease)   . Macular degeneration syndrome   . PONV (postoperative nausea and vomiting)     Past Surgical History  Procedure Laterality Date  . Abdominal hysterectomy    . Tonsillectomy    . Colonoscopy    . Polypectomy    . Laparoscopic appendectomy  03/12/2011    Procedure: APPENDECTOMY LAPAROSCOPIC;  Surgeon: Robyne Askew, MD;  Location: [facility];  Service: General;  Laterality: N/A;    Family History  Problem Relation Age of Onset  . Heart disease    . Cancer    . Diabetes    . Breast cancer Mother     History   Social History  . Marital Status: Married    Spouse Name: N/A    Number of Children: N/A  . Years of Education: N/A   Social History Main Topics  . Smoking status: Never Smoker   . Smokeless tobacco: Never Used  . Alcohol Use: Yes     Comment: occasional wine  . Drug Use: No  . Sexual Activity: Yes   Other Topics Concern  .  None   Social History Narrative  . None    Current outpatient prescriptions:ciclopirox (PENLAC) 8 % solution, USE ON NAIL & SURROUNDING SKIN AT BED.USE DAILY OVER PREVIOUS COAT X7DAYS. REMOVE W/ALCOHOL. RESTART, Disp: 6.6 mL, Rfl: 0;  ergocalciferol (VITAMIN D2) 50000 UNITS capsule, Take 50,000 Units by mouth once a week., Disp: , Rfl: ;  estradiol (VIVELLE-DOT) 0.0375 MG/24HR, Place 1 patch onto the skin once a week. Apply once a week, Disp: , Rfl:  latanoprost (XALATAN) 0.005 % ophthalmic solution, Place 1 drop into both eyes at bedtime.  , Disp: , Rfl: ;  loratadine (CLARITIN) 10 MG tablet, Take 10 mg by mouth daily as needed. , Disp: , Rfl: ;  calcium-vitamin D (OSCAL WITH D) 500-200 MG-UNIT per tablet, Take 1 tablet by mouth daily. Takes on and off, Disp: , Rfl: ;  esomeprazole (NEXIUM) 40 MG capsule, Take 40 mg by mouth daily as needed., Disp: , Rfl:  naproxen (NAPROSYN) 500 MG tablet, Take 1 tablet (500 mg total) by mouth 2 (two) times daily with a meal., Disp: 20 tablet, Rfl: 0  EXAM:  Filed Vitals:   12/05/12 0938  BP: 120/90  Temp: 97.7 F (36.5 C)    Body mass index is  24.01 kg/(m^2).  GENERAL: vitals reviewed and listed above, alert, oriented, appears well hydrated and in no acute distress  HEENT: atraumatic, conjunttiva clear, no obvious abnormalities on inspection of external nose and ears, slight deviation of jaw with opening - no catching, pain over R TMH  NECK/MS: no obvious masses on inspection, ROM of neck mildly limited by pain, no bony TTP, R trapezius muscle strain, spurling neg, normal muscle strength and sensation to light touch in UE bilat  PSYCH: pleasant and cooperative, no obvious depression or anxiety  ASSESSMENT AND PLAN:  Discussed the following assessment and plan:  Neck pain - Plan: naproxen (NAPROSYN) 500 MG tablet  Muscle spasm - Plan: naproxen (NAPROSYN) 500 MG tablet  TMJ (dislocation of temporomandibular joint), initial encounter  -recs per  below for muscle spasm -possible radiculopathy but resolved and no alarm features or deficits on exam - return recs -discussed risks of medications -she will do exercises and see her dentist for the TMJ whom is treating this -follow up prn -Patient advised to return or notify a doctor immediately if symptoms worsen or persist or new concerns arise.  Patient Instructions  -you can take naproxen (per instructions) or tylenol (1000mg  3 times daily)  -do exercises  -heat 15 minutes several times per day  -capsacin or menthol (tiger balm is one example) containing sports creams  -follow up in 3-4 weeks if not better     KIM, HANNAH R.

## 2012-12-06 ENCOUNTER — Encounter: Payer: Self-pay | Admitting: Internal Medicine

## 2013-03-03 ENCOUNTER — Other Ambulatory Visit: Payer: Self-pay

## 2013-03-03 DIAGNOSIS — Z803 Family history of malignant neoplasm of breast: Secondary | ICD-10-CM

## 2013-03-03 DIAGNOSIS — Z1231 Encounter for screening mammogram for malignant neoplasm of breast: Secondary | ICD-10-CM

## 2013-03-10 ENCOUNTER — Other Ambulatory Visit: Payer: Self-pay | Admitting: Internal Medicine

## 2013-04-01 ENCOUNTER — Ambulatory Visit
Admission: RE | Admit: 2013-04-01 | Discharge: 2013-04-01 | Disposition: A | Discharge status: Home / Self Care | Payer: BC Managed Care – PPO | Source: Ambulatory Visit

## 2013-04-01 DIAGNOSIS — Z1231 Encounter for screening mammogram for malignant neoplasm of breast: Secondary | ICD-10-CM

## 2013-04-01 DIAGNOSIS — Z803 Family history of malignant neoplasm of breast: Secondary | ICD-10-CM

## 2013-09-30 ENCOUNTER — Telehealth: Payer: Self-pay | Admitting: Internal Medicine

## 2013-09-30 NOTE — Telephone Encounter (Signed)
Pt would like to est with dr kim. Can I sch? °

## 2013-09-30 NOTE — Telephone Encounter (Signed)
Ok, needs new patient visit.

## 2013-10-01 NOTE — Telephone Encounter (Signed)
lmom for pt to cb

## 2013-10-05 NOTE — Telephone Encounter (Signed)
Pt has sch °

## 2013-10-19 ENCOUNTER — Ambulatory Visit (INDEPENDENT_AMBULATORY_CARE_PROVIDER_SITE_OTHER): Payer: BC Managed Care – PPO | Admitting: Family Medicine

## 2013-10-19 ENCOUNTER — Encounter: Payer: Self-pay | Admitting: Family Medicine

## 2013-10-19 VITALS — BP 110/78 | HR 90 | Temp 98.5°F | Ht 64.5 in | Wt 138.0 lb

## 2013-10-19 DIAGNOSIS — F411 Generalized anxiety disorder: Secondary | ICD-10-CM

## 2013-10-19 DIAGNOSIS — L299 Pruritus, unspecified: Secondary | ICD-10-CM

## 2013-10-19 DIAGNOSIS — Z2911 Encounter for prophylactic immunotherapy for respiratory syncytial virus (RSV): Secondary | ICD-10-CM

## 2013-10-19 DIAGNOSIS — Z23 Encounter for immunization: Secondary | ICD-10-CM

## 2013-10-19 DIAGNOSIS — H00019 Hordeolum externum unspecified eye, unspecified eyelid: Secondary | ICD-10-CM

## 2013-10-19 DIAGNOSIS — H00016 Hordeolum externum left eye, unspecified eyelid: Secondary | ICD-10-CM

## 2013-10-19 NOTE — Progress Notes (Signed)
No chief complaint on file.   HPI:  Acute visit for:  1)Itchy eye: -turned 55 in august -sty on eye -sees eye doctor for dry eyes and glaucoma - wondered if this is herpes on the eye lid -denies: pain, vision changes, headache   2)Itchy skin: -under both breasts -itchy back -had headache on L sides in the past -itchy skin is since last night but occurs from time to time and she thinks more left side of body - breasts, leg.  -several times in life had itchy lesion on L ear  Read on the internet and is worried about shingles or herpes as prior husband > 40 years ago had affair. Denies: HA with recent symptoms, any rash, fever, malaise, ulcers    ROS: See pertinent positives and negatives per HPI.  Past Medical History  Diagnosis Date  . Glaucoma   . GERD (gastroesophageal reflux disease)   . Colon polyps   . Diverticulosis, acute   . Anxiety   . Headache(784.0)   . Rosacea   . DJD (degenerative joint disease)   . Macular degeneration syndrome   . PONV (postoperative nausea and vomiting)     Past Surgical History  Procedure Laterality Date  . Abdominal hysterectomy    . Tonsillectomy    . Colonoscopy    . Polypectomy    . Laparoscopic appendectomy  03/12/2011    Procedure: APPENDECTOMY LAPAROSCOPIC;  Surgeon: Merrie Roof, MD;  Location: [facility];  Service: General;  Laterality: N/A;    Family History  Problem Relation Age of Onset  . Heart disease    . Cancer    . Diabetes    . Breast cancer Mother     History   Social History  . Marital Status: Married    Spouse Name: N/A    Number of Children: N/A  . Years of Education: N/A   Social History Main Topics  . Smoking status: Never Smoker   . Smokeless tobacco: Never Used  . Alcohol Use: Yes     Comment: occasional wine  . Drug Use: No  . Sexual Activity: Yes   Other Topics Concern  . None   Social History Narrative  . None    Current outpatient prescriptions:calcium-vitamin D (OSCAL WITH D)  500-200 MG-UNIT per tablet, Take 1 tablet by mouth daily. Takes on and off, Disp: , Rfl: ;  ciclopirox (PENLAC) 8 % solution, USE ON NAIL & SURROUNDING SKIN AT BED.USE DAILY OVER PREVIOUS COAT X7DAYS. REMOVE W/ALCOHOL. RESTART, Disp: 6.6 mL, Rfl: 0;  ergocalciferol (VITAMIN D2) 50000 UNITS capsule, Take 50,000 Units by mouth once a week., Disp: , Rfl:  estradiol (ESTRACE) 0.1 MG/GM vaginal cream, Place 1 Applicatorful vaginally as needed., Disp: , Rfl: ;  estradiol (VIVELLE-DOT) 0.0375 MG/24HR, Place 1 patch onto the skin once a week. Apply once a week, Disp: , Rfl: ;  latanoprost (XALATAN) 0.005 % ophthalmic solution, Place 1 drop into both eyes at bedtime.  , Disp: , Rfl: ;  loratadine (CLARITIN) 10 MG tablet, Take 10 mg by mouth daily as needed. , Disp: , Rfl:  esomeprazole (NEXIUM) 40 MG capsule, Take 40 mg by mouth daily as needed., Disp: , Rfl:   EXAM:  Filed Vitals:   10/19/13 1556  BP: 110/78  Pulse: 90  Temp: 98.5 F (36.9 C)    Body mass index is 23.33 kg/(m^2).  GENERAL: vitals reviewed and listed above, alert, oriented, appears well hydrated and in no acute distress  HEENT:  atraumatic, PERRLA, visual acuity grossly intact, no lesions other then very small papule on L eye lid, conjunttiva clear, no obvious abnormalities on inspection of external nose and ears  NECK: no obvious masses on inspection  LUNGS: clear to auscultation bilaterally, no wheezes, rales or rhonchi, good air movement  CV: HRRR, no peripheral edema  SKIN: appears normal  MS: moves all extremities without noticeable abnormality  PSYCH: pleasant and cooperative, no obvious depression or anxiety  ASSESSMENT AND PLAN:  Discussed the following assessment and plan:  Need for prophylactic vaccination and inoculation against other viral diseases(V04.89) - Plan: Varicella-zoster vaccine subcutaneous  Sty, left  Itchy skin  Anxiety state, unspecified  -sty on eye - no signs or symptoms suggest viral  infection - discussed serology for herpes given her fears, tx, suppressive tx if had recurrent symptoms, advised to seek care immediately if ever has any ulcer to test lesion -discussed causes of itchy skin and tx, seems more likely dry skin or anxiety -advised to schedule new patient visit -Patient advised to return or notify a doctor immediately if symptoms worsen or persist or new concerns arise.  There are no Patient Instructions on file for this visit.   Colin Benton R.

## 2013-10-19 NOTE — Progress Notes (Signed)
Pre visit review using our clinic review tool, if applicable. No additional management support is needed unless otherwise documented below in the visit note. 

## 2013-10-27 ENCOUNTER — Ambulatory Visit (INDEPENDENT_AMBULATORY_CARE_PROVIDER_SITE_OTHER): Payer: BC Managed Care – PPO | Admitting: *Deleted

## 2013-10-27 DIAGNOSIS — Z23 Encounter for immunization: Secondary | ICD-10-CM

## 2013-11-25 ENCOUNTER — Ambulatory Visit: Payer: BC Managed Care – PPO | Admitting: Family Medicine

## 2014-01-05 ENCOUNTER — Encounter: Payer: Self-pay | Admitting: Family Medicine

## 2014-01-05 ENCOUNTER — Ambulatory Visit (INDEPENDENT_AMBULATORY_CARE_PROVIDER_SITE_OTHER): Payer: BC Managed Care – PPO | Admitting: Family Medicine

## 2014-01-05 VITALS — BP 92/60 | HR 82 | Temp 98.0°F | Ht 64.5 in | Wt 135.8 lb

## 2014-01-05 DIAGNOSIS — Z7189 Other specified counseling: Secondary | ICD-10-CM

## 2014-01-05 DIAGNOSIS — M542 Cervicalgia: Secondary | ICD-10-CM

## 2014-01-05 DIAGNOSIS — R51 Headache: Secondary | ICD-10-CM

## 2014-01-05 DIAGNOSIS — H6982 Other specified disorders of Eustachian tube, left ear: Secondary | ICD-10-CM

## 2014-01-05 DIAGNOSIS — Z7689 Persons encountering health services in other specified circumstances: Secondary | ICD-10-CM

## 2014-01-05 DIAGNOSIS — R519 Headache, unspecified: Secondary | ICD-10-CM | POA: Insufficient documentation

## 2014-01-05 NOTE — Patient Instructions (Addendum)
BEFORE YOU LEAVE: -schedule morning appt for physical and follow up in the next 2-3 months - come fasting, drink plenty of water - we will do your labs that day  FOR THE HEADACHES: -do the exercises for the neck at least 4 days per week -please find a pillow that supports your spinal alignment -please limit any pain medications for the headaches (tylenol, asa, aleve, etc ) to no more then 2 times per week -can use flexeril occassionally for the neck spasm for now -try topical menthol products (tiger balm, biofreeze) - do not get in eyes     Flonase daily for the ear issues for one month

## 2014-01-05 NOTE — Progress Notes (Signed)
Pre visit review using our clinic review tool, if applicable. No additional management support is needed unless otherwise documented below in the visit note. 

## 2014-01-05 NOTE — Progress Notes (Signed)
HPI:  Erica Kramer is here to establish care.  Last PCP and physical: goes to [facility] ob/gyn for annual physicals her HRT and hot flashes.  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 then in band, a few migraines with light sensitivity per year -alcohol triggers headaches for her -pretty much daily - used celebrex for this with prior PCP and helped, then stopped due to risk adverse effects -usually takes tylenol almost daily some weeks -flexeril really helped with this but doesn't know if should take -not doing exercise for neck now -no new or changed -coffee and activity helps -denies:nausea, vomiting  ROS negative for unless reported above: fevers, unintentional weight loss, hearing or vision loss, chest pain, palpitations, struggling to breath, hemoptysis, melena, hematochezia, hematuria, falls, loc, si, thoughts of self harm  Past Medical History  Diagnosis Date  . Glaucoma   . Diverticulosis, acute   . Anxiety   . Headache(784.0)   . Rosacea   . DJD (degenerative joint disease)   . Macular degeneration syndrome   . PONV (postoperative nausea and vomiting)   . Chronic frontal sinusitis 01/28/2007    Qualifier: Diagnosis of  By: Arnoldo Morale MD, Rinaldo Ratel, SKIN/NAILS 01/28/2007    Qualifier: Diagnosis of  By: Arnoldo Morale MD, Balinda Quails   . GERD 09/02/2008    Qualifier: Diagnosis of  By: Fuller Plan MD Lamont Snowball T   . HEMATOCHEZIA 09/02/2008    Qualifier: Diagnosis of  By: Fuller Plan MD FACG, [place], ADENOMATOUS, HX OF 08/30/2008    Qualifier: Diagnosis of  By: Harlon Ditty  CMA (AAMA), Dottie    . Neck pain, chronic   . TMJ (temporomandibular joint disorder)     Past Surgical History  Procedure Laterality Date  . Abdominal hysterectomy    . Tonsillectomy    . Colonoscopy    . Polypectomy    . Laparoscopic appendectomy  03/12/2011    Procedure: APPENDECTOMY LAPAROSCOPIC;  Surgeon: Merrie Roof, MD;  Location: [facility];  Service: General;  Laterality: N/A;    Family History  Problem Relation Age of Onset  . Heart disease    . Cancer    . Diabetes    . Breast cancer Mother     History   Social History  . Marital Status: Married    Spouse Name: N/A    Number of Children: N/A  . Years of Education: N/A   Social History Main Topics  . Smoking status: Never Smoker   . Smokeless tobacco: Never Used  . Alcohol Use: Yes     Comment: occasional wine  . Drug Use: No  . Sexual Activity: Yes   Other Topics Concern  . 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 Takes on and off, Disp: , Rfl: ;  ciclopirox (PENLAC) 8 % solution, USE ON NAIL & SURROUNDING SKIN AT BED.USE DAILY OVER PREVIOUS COAT X7DAYS. REMOVE W/ALCOHOL. RESTART, Disp: 6.6 mL, Rfl: 0;  cyclobenzaprine (FLEXERIL) 5 MG tablet, Take 5 mg by mouth as needed for muscle spasms (0.5mg  as needed)., Disp: , Rfl:  ergocalciferol (VITAMIN D2) 50000 UNITS capsule, Take 50,000 Units by mouth once a week., Disp: , Rfl: ;  estradiol (ESTRACE) 0.1 MG/GM vaginal cream, Place 1 Applicatorful vaginally as needed., Disp: , Rfl: ;  estradiol (VIVELLE-DOT) 0.0375 MG/24HR, Place 1 patch onto the skin once a week. Apply  once a week, Disp: , Rfl: ;  latanoprost (XALATAN) 0.005 % ophthalmic solution, Place 1 drop into both eyes at bedtime.  , Disp: , Rfl:  loratadine (CLARITIN) 10 MG tablet, Take 10 mg by mouth daily as needed. , Disp: , Rfl:   EXAM:  Filed Vitals:   01/05/14 1629  BP: 92/60  Pulse: 82  Temp: 98 F (36.7 C)    Body mass index is 22.96 kg/(m^2).  GENERAL: vitals reviewed and listed above, alert, oriented, appears well hydrated and in no acute distress  HEENT: atraumatic, conjunttiva clear, PERRLA, no obvious abnormalities on inspection of external nose and ears, normal appearance of ear canals and TMs except for mild effusion L, clear nasal congestion, mild post oropharyngeal erythema with PND, no tonsillar edema or exudate, no sinus TTP, no temp art TTP  NECK: no obvious masses on inspection, suboccip muscle tension, head forward posture, no bony TTP  LUNGS: clear to auscultation bilaterally, no wheezes, rales or rhonchi, good air movement  CV: HRRR, no peripheral edema  MS: moves all extremities without noticeable abnormality  PSYCH: pleasant and cooperative, no obvious depression or anxiety  NEURO: PERRLA, CN II-XII grossly intact, finger to nose normal  ASSESSMENT AND PLAN:  Discussed the following assessment and plan:  Frequent headaches Neck pain -likely related, unchanged, going on for many years -? Multifactorial, migraine/tension, some degree of rebound -wean down on analgesics -neck spasm HEP -topical menthol and flexeril occ as needed -trigger avoidance -follow up in 1-2 months at CPE  Eustachian tube dysfunction, left -INS -follow up in 1-2 months  Encounter to establish care -We reviewed the PMH, PSH, FH, SH, Meds and Allergies. -We provided refills for any medications we will prescribe as needed. -We addressed current concerns per orders and patient instructions. -We have asked for records for pertinent exams, studies, vaccines and notes from previous  providers. -We have advised patient to follow up per instructions below. -needs CPE and will do this at follow up  -Patient advised to return or notify a doctor immediately if symptoms worsen or persist or new concerns arise.  Patient Instructions  BEFORE YOU LEAVE: -schedule morning appt for physical and follow up in the next 2-3 months - come fasting, drink plenty of water - we will do your labs that day  FOR THE HEADACHES: -do the exercises for the neck at least 4 days per week -please find a pillow that supports your spinal alignment -please limit any pain medications for the headaches (tylenol, asa, aleve, etc ) to no more then 2 times per week -can use flexeril occassionally for the neck spasm for now -try topical menthol products (tiger balm, biofreeze) - do not get in eyes     Flonase daily for the  ear issues for one month         Alayne Estrella R.

## 2014-02-26 ENCOUNTER — Other Ambulatory Visit: Payer: Self-pay

## 2014-02-26 DIAGNOSIS — Z1231 Encounter for screening mammogram for malignant neoplasm of breast: Secondary | ICD-10-CM

## 2014-04-06 ENCOUNTER — Ambulatory Visit
Admission: RE | Admit: 2014-04-06 | Discharge: 2014-04-06 | Disposition: A | Discharge status: Home / Self Care | Payer: BLUE CROSS/BLUE SHIELD | Source: Ambulatory Visit

## 2014-04-06 DIAGNOSIS — Z1231 Encounter for screening mammogram for malignant neoplasm of breast: Secondary | ICD-10-CM

## 2014-04-09 ENCOUNTER — Other Ambulatory Visit: Payer: Self-pay | Admitting: Family Medicine

## 2014-04-15 ENCOUNTER — Other Ambulatory Visit: Payer: Self-pay | Admitting: *Deleted

## 2014-06-25 ENCOUNTER — Encounter: Payer: Self-pay | Admitting: Gastroenterology

## 2014-11-23 ENCOUNTER — Ambulatory Visit (INDEPENDENT_AMBULATORY_CARE_PROVIDER_SITE_OTHER): Payer: BLUE CROSS/BLUE SHIELD | Admitting: Family Medicine

## 2014-11-23 ENCOUNTER — Encounter: Payer: Self-pay | Admitting: Family Medicine

## 2014-11-23 VITALS — BP 118/72 | HR 84 | Temp 97.9°F | Ht 64.0 in | Wt 137.4 lb

## 2014-11-23 DIAGNOSIS — Z0000 Encounter for general adult medical examination without abnormal findings: Secondary | ICD-10-CM | POA: Diagnosis not present

## 2014-11-23 LAB — HEMOGLOBIN A1C: Hgb A1c MFr Bld: 5.6 % (ref 4.6–6.5)

## 2014-11-23 LAB — CHOLESTEROL, TOTAL: Cholesterol: 217 mg/dL — ABNORMAL HIGH (ref 0–200)

## 2014-11-23 LAB — HDL CHOLESTEROL: HDL: 80.5 mg/dL (ref >39.00)

## 2014-11-23 NOTE — Progress Notes (Signed)
HPI:  Here for CPE:  -Concerns and/or follow up today: none  -Diet: variety of foods, balance and well rounded, larger portion sizes - reports has trouble eating the right things sometimes and has gained weight and she plans to do better.  -Exercise: no regular aerobic exercise  -Taking folic acid, vitamin D or calcium: no  -Diabetes and Dyslipidemia Screening: today, NOT fasting  -Hx of HTN: no  -Vaccines: UTD  -pap history: done with gyn  -FDLMP: n/a  -sexual activity: yes, female partner, no new partners  -wants STI testing (Hep C if born [birth year]): no  -FH breast, colon or ovarian ca: see FH Last mammogram: done Last colon cancer screening: done  Sees gyn for breast and women's health exams   -Alcohol, Tobacco, drug use: see social history  Review of Systems - no fevers, unintentional weight loss, vision loss, hearing loss, chest pain, sob, hemoptysis, melena, hematochezia, hematuria, genital discharge, changing or concerning skin lesions, bleeding, bruising, loc, thoughts of self harm or SI  Past Medical History  Diagnosis Date  . Glaucoma   . Diverticulosis, acute   . Anxiety   . Headache(784.0)   . Rosacea   . DJD (degenerative joint disease)   . Macular degeneration syndrome   . PONV (postoperative nausea and vomiting)   . Chronic frontal sinusitis 01/28/2007    Qualifier: Diagnosis of  By: Arnoldo Morale MD, Rinaldo Ratel, SKIN/NAILS 01/28/2007    Qualifier: Diagnosis of  By: Arnoldo Morale MD, Balinda Quails   . GERD 09/02/2008    Qualifier: Diagnosis of  By: Fuller Plan MD Lamont Snowball T   . HEMATOCHEZIA 09/02/2008    Qualifier: Diagnosis of  By: Fuller Plan MD FACG, [place], ADENOMATOUS, HX OF 08/30/2008    Qualifier: Diagnosis of  By: Harlon Ditty CMA (AAMA), Dottie    . Neck pain, chronic   . TMJ (temporomandibular joint disorder)     Past Surgical History  Procedure Laterality Date  . Abdominal hysterectomy    . Tonsillectomy    . Colonoscopy    .  Polypectomy    . Laparoscopic appendectomy  03/12/2011    Procedure: APPENDECTOMY LAPAROSCOPIC;  Surgeon: Merrie Roof, MD;  Location: [facility];  Service: General;  Laterality: N/A;    Family History  Problem Relation Age of Onset  . Heart disease    . Cancer    . Diabetes    . Breast cancer Mother     Social History   Social History  . Marital Status: Married    Spouse Name: N/A  . Number of Children: N/A  . Years of Education: N/A   Social History Main Topics  . Smoking status: Never Smoker   . Smokeless tobacco: Never Used  . Alcohol Use: Yes     Comment: occasional wine  . Drug Use: No  . Sexual Activity: Yes   Other Topics Concern  . 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  calcium-vitamin D (OSCAL WITH D) 500-200 MG-UNIT per tablet, Take 1 tablet by mouth daily.  Takes on and off, Disp: , Rfl:  .  ergocalciferol (VITAMIN D2) 50000 UNITS capsule, Take 50,000 Units by mouth once a week., Disp: , Rfl:  .  estradiol (ESTRACE) 0.1 MG/GM vaginal cream, Place 1 Applicatorful vaginally as needed., Disp: , Rfl:  .  estradiol (VIVELLE-DOT) 0.0375 MG/24HR, Place 1 patch onto the skin once a week. Apply once a week, Disp: , Rfl:  .  fluticasone (FLONASE) 50 MCG/ACT nasal spray, Place into both nostrils daily., Disp: , Rfl:  .  latanoprost (XALATAN) 0.005 % ophthalmic solution, Place 1 drop into both eyes at bedtime.  , Disp: , Rfl:  .  loratadine (CLARITIN) 10 MG tablet, Take 10 mg by mouth daily as needed. , Disp: , Rfl:   EXAM:  Filed Vitals:   11/23/14 1509  BP: 118/72  Pulse: 84  Temp: 97.9 F (36.6 C)    GENERAL: vitals reviewed and listed below, alert,  oriented, appears well hydrated and in no acute distress  HEENT: head atraumatic, PERRLA, normal appearance of eyes, ears, nose and mouth. moist mucus membranes.  NECK: supple, no masses or lymphadenopathy  LUNGS: clear to auscultation bilaterally, no rales, rhonchi or wheeze  CV: HRRR, no peripheral edema or cyanosis, normal pedal pulses  BREAST: deferred, does with gyn  ABDOMEN: bowel sounds normal, soft, non tender to palpation, no masses, no rebound or guarding  GU: deferred - does with gyn SKIN: no rash or abnormal lesions  MS: normal gait, moves all extremities normally  NEURO: CN II-XII grossly intact, normal muscle strength and sensation to light touch on extremities  PSYCH: normal affect, pleasant and cooperative  ASSESSMENT AND PLAN:  Discussed the following assessment and plan:  Visit for preventive health examination - Plan: Hemoglobin A1c, Cholesterol, Total, HDL cholesterol   -Discussed and advised all [place] preventive services health task force level A and B recommendations for age, sex and risks.  -Advised at least 150 minutes of exercise per week and a healthy diet low in saturated fats and sweets and consisting of fresh fruits and vegetables, lean meats such as fish and white chicken and whole grains.  -labs, studies and vaccines per orders this encounter  Orders Placed This Encounter  Procedures  . Hemoglobin A1c  . Cholesterol, Total  . HDL cholesterol    Patient advised to return to clinic immediately if symptoms worsen or persist or new concerns.  Patient Instructions  BEFORE YOU LEAVE: -labs -follow up yearly  We recommend the following healthy lifestyle measures: - eat a healthy whole foods diet consisting of regular small meals composed of vegetables, fruits, beans, nuts, seeds, healthy meats such as white chicken and fish and whole grains.  - avoid sweets, white starchy foods, fried foods, fast food, processed foods, sodas, red meet and other  fattening foods.  - get a least 150-300 minutes of aerobic exercise per week.      No Follow-up on file.  Colin Benton R.

## 2014-11-23 NOTE — Progress Notes (Signed)
Pre visit review using our clinic review tool, if applicable. No additional management support is needed unless otherwise documented below in the visit note. 

## 2014-11-23 NOTE — Patient Instructions (Signed)
BEFORE YOU LEAVE: -labs -follow up yearly  We recommend the following healthy lifestyle measures: - eat a healthy whole foods diet consisting of regular small meals composed of vegetables, fruits, beans, nuts, seeds, healthy meats such as white chicken and fish and whole grains.  - avoid sweets, white starchy foods, fried foods, fast food, processed foods, sodas, red meet and other fattening foods.  - get a least 150-300 minutes of aerobic exercise per week.

## 2014-11-27 ENCOUNTER — Other Ambulatory Visit: Payer: Self-pay | Admitting: Obstetrics and Gynecology

## 2014-11-27 DIAGNOSIS — M8580 Other specified disorders of bone density and structure, unspecified site: Secondary | ICD-10-CM

## 2014-11-27 DIAGNOSIS — Z780 Asymptomatic menopausal state: Secondary | ICD-10-CM

## 2014-11-29 ENCOUNTER — Other Ambulatory Visit: Payer: Self-pay

## 2014-11-29 DIAGNOSIS — Z1231 Encounter for screening mammogram for malignant neoplasm of breast: Secondary | ICD-10-CM

## 2014-12-09 ENCOUNTER — Other Ambulatory Visit: Payer: Self-pay | Admitting: Family Medicine

## 2015-02-22 ENCOUNTER — Encounter: Payer: Self-pay | Admitting: Family Medicine

## 2015-02-22 ENCOUNTER — Ambulatory Visit (INDEPENDENT_AMBULATORY_CARE_PROVIDER_SITE_OTHER): Payer: BLUE CROSS/BLUE SHIELD | Admitting: Family Medicine

## 2015-02-22 VITALS — BP 118/82 | HR 85 | Temp 98.1°F | Ht 64.0 in | Wt 141.9 lb

## 2015-02-22 DIAGNOSIS — B349 Viral infection, unspecified: Secondary | ICD-10-CM

## 2015-02-22 LAB — POCT INFLUENZA A/B
Influenza A, POC: NEGATIVE
Influenza B, POC: NEGATIVE

## 2015-02-22 NOTE — Progress Notes (Signed)
HPI:  Acute visit for:  Malaise: -started: 3 days ago -symptoms:nasal congestion, sore throat, PND, HA, nausea, body aches, chills -denies:fever, SOB, VD, tooth pain, sinus pain, rash, joint pains, tick bite, flu exposure, dysuria, abd pain -has tried: tylenol which helped  ROS: See pertinent positives and negatives per HPI.  Past Medical History  Diagnosis Date  . Glaucoma   . Diverticulosis, acute   . Anxiety   . Headache(784.0)   . Rosacea   . DJD (degenerative joint disease)   . Macular degeneration syndrome   . PONV (postoperative nausea and vomiting)   . Chronic frontal sinusitis 01/28/2007    Qualifier: Diagnosis of  By: Arnoldo Morale MD, Rinaldo Ratel, SKIN/NAILS 01/28/2007    Qualifier: Diagnosis of  By: Arnoldo Morale MD, Balinda Quails   . GERD 09/02/2008    Qualifier: Diagnosis of  By: Fuller Plan MD Lamont Snowball T   . HEMATOCHEZIA 09/02/2008    Qualifier: Diagnosis of  By: Fuller Plan MD FACG, [place], ADENOMATOUS, HX OF 08/30/2008    Qualifier: Diagnosis of  By: Harlon Ditty CMA (AAMA), Dottie    . Neck pain, chronic   . TMJ (temporomandibular joint disorder)     Past Surgical History  Procedure Laterality Date  . Abdominal hysterectomy    . Tonsillectomy    . Colonoscopy    . Polypectomy    . Laparoscopic appendectomy  03/12/2011    Procedure: APPENDECTOMY LAPAROSCOPIC;  Surgeon: Merrie Roof, MD;  Location: [facility];  Service: General;  Laterality: N/A;    Family History  Problem Relation Age of Onset  . Heart disease    . Cancer    . Diabetes    . Breast cancer Mother     Social History   Social History  . Marital Status: Married    Spouse Name: N/A  . Number of Children: N/A  . Years of Education: N/A   Social History Main Topics  . Smoking status: Never Smoker   . Smokeless tobacco: Never Used  . Alcohol Use: Yes     Comment: occasional wine  . Drug Use: No  . Sexual Activity: Yes   Other Topics Concern  . 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  calcium-vitamin D (OSCAL WITH D) 500-200 MG-UNIT per tablet, Take 1 tablet by mouth daily. Takes on and off, Disp: , Rfl:  .  ciclopirox (PENLAC) 8 % solution, USE ON NAIL & SURROUNDING SKIN AT BED.USE DAILY OVER PREVIOUS COAT X7DAYS. REMOVE W/ALCOHOL. RESTART, Disp: 6.6 mL, Rfl: 0 .  ergocalciferol (VITAMIN D2) 50000 UNITS capsule, Take 50,000 Units by mouth once a week., Disp: , Rfl:  .  estradiol (ESTRACE) 0.1 MG/GM vaginal cream, Place 1 Applicatorful vaginally as needed., Disp: , Rfl:  .  estradiol (VIVELLE-DOT) 0.0375 MG/24HR, Place 1 patch onto the skin once a week. Apply once a week, Disp: , Rfl:  .  fluticasone (FLONASE) 50 MCG/ACT nasal spray, Place into both nostrils daily., Disp: , Rfl:  .  latanoprost (XALATAN) 0.005 % ophthalmic  solution, Place 1 drop into both eyes at bedtime.  , Disp: , Rfl:  .  loratadine (CLARITIN) 10 MG tablet, Take 10 mg by mouth daily as needed. , Disp: , Rfl:   EXAM:  Filed Vitals:   02/22/15 1345  BP: 118/82  Pulse: 85  Temp: 98.1 F (36.7 C)    Body mass index is 24.34 kg/(m^2).  GENERAL: vitals reviewed and listed above, alert, oriented, appears well hydrated and in no acute distress  HEENT: atraumatic, conjunttiva clear, no obvious abnormalities on inspection of external nose and ears, normal appearance of ear canals and TMs, clear nasal congestion, mild post oropharyngeal erythema with PND, no tonsillar edema or exudate, no sinus TTP  NECK: no obvious masses on inspection  LUNGS: clear to auscultation bilaterally, no wheezes, rales or rhonchi, good air movement  CV: HRRR, no peripheral edema  MS: moves all  extremities without noticeable abnormality  PSYCH: pleasant and cooperative, no obvious depression or anxiety  ASSESSMENT AND PLAN:  Discussed the following assessment and plan:  Viral illness  -given HPI and exam findings today, a serious infection or illness is unlikely. We discussed potential etiologies, with viral illness being most likely, and advised supportive care and monitoring. Discussed possibility of mild flu even though had flu shot and she wanted rapid test for this but no tx.We discussed treatment side effects, likely course, antibiotic misuse, transmission, and signs of developing a serious illness. -of course, we advised to return or notify a doctor immediately if symptoms worsen or persist or new concerns arise.    Patient Instructions  Viral Infections A virus is a type of germ. Viruses can cause:  Minor sore throats.  Aches and pains.  Headaches.  Runny nose.  Rashes.  Watery eyes.  Tiredness.  Coughs.  Loss of appetite.  Feeling sick to your stomach (nausea).  Throwing up (vomiting).  Watery poop (diarrhea). HOME CARE   Only take medicines as told by your doctor.  Drink enough water and fluids to keep your pee (urine) clear or pale yellow. Sports drinks are a good choice.  Get plenty of rest and eat healthy. Soups and broths with crackers or rice are fine. GET HELP RIGHT AWAY IF:   You have a very bad headache.  You have shortness of breath.  You have chest pain   You have an unusual rash.  You cannot stop throwing up.  You have watery poop that does not stop.  You cannot keep fluids down.  You or your child has a temperature by mouth above 102 F (38.9 C), not controlled by medicine.  Your baby is older than 3 months with a rectal temperature of 102 F (38.9 C) or higher.  Your baby is 8 months old or younger with a rectal temperature of 100.4 F (38 C) or higher. MAKE SURE YOU:   Understand these instructions.  Will  watch this condition.  Will get help right away if you are not doing well or get worse.   This information is not intended to replace advice given to you by your health care provider. Make sure you discuss any questions you have with your health care provider.   Document Released: 12/22/2007 Document Revised: 04/02/2011 Document Reviewed: 06/16/2014 Elsevier Interactive Patient Education 2016 [place], [place]

## 2015-02-22 NOTE — Patient Instructions (Signed)
Viral Infections A virus is a type of germ. Viruses can cause:  Minor sore throats.  Aches and pains.  Headaches.  Runny nose.  Rashes.  Watery eyes.  Tiredness.  Coughs.  Loss of appetite.  Feeling sick to your stomach (nausea).  Throwing up (vomiting).  Watery poop (diarrhea). HOME CARE   Only take medicines as told by your doctor.  Drink enough water and fluids to keep your pee (urine) clear or pale yellow. Sports drinks are a good choice.  Get plenty of rest and eat healthy. Soups and broths with crackers or rice are fine. GET HELP RIGHT AWAY IF:   You have a very bad headache.  You have shortness of breath.  You have chest pain   You have an unusual rash.  You cannot stop throwing up.  You have watery poop that does not stop.  You cannot keep fluids down.  You or your child has a temperature by mouth above 102 F (38.9 C), not controlled by medicine.  Your baby is older than 3 months with a rectal temperature of 102 F (38.9 C) or higher.  Your baby is 1 months old or younger with a rectal temperature of 100.4 F (38 C) or higher. MAKE SURE YOU:   Understand these instructions.  Will watch this condition.  Will get help right away if you are not doing well or get worse.   This information is not intended to replace advice given to you by your health care provider. Make sure you discuss any questions you have with your health care provider.   Document Released: 12/22/2007 Document Revised: 04/02/2011 Document Reviewed: 06/16/2014 Elsevier Interactive Patient Education [employer].

## 2015-02-22 NOTE — Addendum Note (Signed)
Addended by: Agnes Lawrence on: 02/22/2015 02:21 PM   Modules accepted: Orders

## 2015-02-22 NOTE — Progress Notes (Signed)
Pre visit review using our clinic review tool, if applicable. No additional management support is needed unless otherwise documented below in the visit note. 

## 2015-03-03 ENCOUNTER — Encounter: Payer: Self-pay | Admitting: Family Medicine

## 2015-03-03 ENCOUNTER — Ambulatory Visit (INDEPENDENT_AMBULATORY_CARE_PROVIDER_SITE_OTHER): Payer: BLUE CROSS/BLUE SHIELD | Admitting: Family Medicine

## 2015-03-03 ENCOUNTER — Ambulatory Visit (INDEPENDENT_AMBULATORY_CARE_PROVIDER_SITE_OTHER)
Admission: RE | Admit: 2015-03-03 | Discharge: 2015-03-03 | Disposition: A | Discharge status: Home / Self Care | Payer: BLUE CROSS/BLUE SHIELD | Source: Ambulatory Visit | Attending: Family Medicine | Admitting: Family Medicine

## 2015-03-03 ENCOUNTER — Telehealth: Payer: Self-pay | Admitting: Family Medicine

## 2015-03-03 VITALS — BP 92/70 | HR 75 | Temp 97.8°F | Ht 64.0 in | Wt 141.8 lb

## 2015-03-03 DIAGNOSIS — R5381 Other malaise: Secondary | ICD-10-CM | POA: Diagnosis not present

## 2015-03-03 DIAGNOSIS — R1032 Left lower quadrant pain: Secondary | ICD-10-CM

## 2015-03-03 LAB — CBC WITH DIFFERENTIAL/PLATELET
Basophils Absolute: 0 10*3/uL (ref 0.0–0.1)
Basophils Relative: 0.5 % (ref 0.0–3.0)
Eosinophils Absolute: 0.1 10*3/uL (ref 0.0–0.7)
Eosinophils Relative: 2.8 % (ref 0.0–5.0)
HCT: 43 % (ref 36.0–46.0)
Hemoglobin: 14.4 g/dL (ref 12.0–15.0)
Lymphocytes Relative: 34.5 % (ref 12.0–46.0)
Lymphs Abs: 1.7 10*3/uL (ref 0.7–4.0)
MCHC: 33.4 g/dL (ref 30.0–36.0)
MCV: 94.1 fl (ref 78.0–100.0)
Monocytes Absolute: 0.5 10*3/uL (ref 0.1–1.0)
Monocytes Relative: 9.3 % (ref 3.0–12.0)
Neutro Abs: 2.6 10*3/uL (ref 1.4–7.7)
Neutrophils Relative %: 52.9 % (ref 43.0–77.0)
Platelets: 209 10*3/uL (ref 150.0–400.0)
RBC: 4.57 Mil/uL (ref 3.87–5.11)
RDW: 13.2 % (ref 11.5–15.5)
WBC: 4.9 10*3/uL (ref 4.0–10.5)

## 2015-03-03 LAB — COMPREHENSIVE METABOLIC PANEL
ALT: 18 U/L (ref 0–35)
AST: 19 U/L (ref 0–37)
Albumin: 4.3 g/dL (ref 3.5–5.2)
Alkaline Phosphatase: 72 U/L (ref 39–117)
BUN: 12 mg/dL (ref 6–23)
CO2: 32 mEq/L (ref 19–32)
Calcium: 9.5 mg/dL (ref 8.4–10.5)
Chloride: 103 mEq/L (ref 96–112)
Creatinine, Ser: 0.79 mg/dL (ref 0.40–1.20)
GFR: 78.51 mL/min (ref >60.00)
Glucose, Bld: 89 mg/dL (ref 70–99)
Potassium: 4.7 mEq/L (ref 3.5–5.1)
Sodium: 139 mEq/L (ref 135–145)
Total Bilirubin: 0.5 mg/dL (ref 0.2–1.2)
Total Protein: 7.1 g/dL (ref 6.0–8.3)

## 2015-03-03 LAB — POC URINALSYSI DIPSTICK (AUTOMATED)
Bilirubin, UA: NEGATIVE
Blood, UA: NEGATIVE
Glucose, UA: NEGATIVE
Ketones, UA: NEGATIVE
Leukocytes, UA: NEGATIVE
Nitrite, UA: NEGATIVE
Protein, UA: NEGATIVE
Spec Grav, UA: 1.02
Urobilinogen, UA: 0.2
pH, UA: 7.5

## 2015-03-03 MED ORDER — IOHEXOL 300 MG/ML  SOLN
100.0000 mL | Freq: Once | INTRAMUSCULAR | Status: AC | PRN
  Administered 2015-03-03: 100 mL via INTRAVENOUS

## 2015-03-03 NOTE — Addendum Note (Signed)
Addended by: Agnes Lawrence on: 03/03/2015 09:54 AM   Modules accepted: Orders

## 2015-03-03 NOTE — Telephone Encounter (Signed)
CT and labs all good. Spoke with pt. She struggles with chronic constipation and has been backed up a little - this is likely the cause of her discomfort. Opted ofr fiber supplement and mirilax and advised follow up if any worsening, persistent symptoms or new concerns.

## 2015-03-03 NOTE — Patient Instructions (Signed)
BEFORE YOU LEAVE: -appointment detail for CT scan -labs

## 2015-03-03 NOTE — Progress Notes (Signed)
HPI:   Acute visit for:  LLQ pain: -had mild flu like symptoms last week - see notes - this has resolved and reports feels "much better" -however has had some mild difuse abd discomfort and nausea with pain primarily in the LLQ -denies: fevers, vomiting, diarrhea, constipation, change in bowel, bleeding, weight loss, dysuria -hx diverticulosis  ROS: See pertinent positives and negatives per HPI.  Past Medical History  Diagnosis Date  . Glaucoma   . Diverticulosis, acute   . Anxiety   . Headache(784.0)   . Rosacea   . DJD (degenerative joint disease)   . Macular degeneration syndrome   . PONV (postoperative nausea and vomiting)   . Chronic frontal sinusitis 01/28/2007    Qualifier: Diagnosis of  By: Arnoldo Morale MD, Rinaldo Ratel, SKIN/NAILS 01/28/2007    Qualifier: Diagnosis of  By: Arnoldo Morale MD, Balinda Quails   . GERD 09/02/2008    Qualifier: Diagnosis of  By: Fuller Plan MD Lamont Snowball T   . HEMATOCHEZIA 09/02/2008    Qualifier: Diagnosis of  By: Fuller Plan MD FACG, [place], ADENOMATOUS, HX OF 08/30/2008    Qualifier: Diagnosis of  By: Harlon Ditty CMA (AAMA), Dottie    . Neck pain, chronic   . TMJ (temporomandibular joint disorder)     Past Surgical History  Procedure Laterality Date  . Abdominal hysterectomy    . Tonsillectomy    . Colonoscopy    . Polypectomy    . Laparoscopic appendectomy  03/12/2011    Procedure: APPENDECTOMY LAPAROSCOPIC;  Surgeon: Merrie Roof, MD;  Location: [facility];  Service: General;  Laterality: N/A;    Family History  Problem Relation Age of Onset  . Heart disease    . Cancer    . Diabetes    . Breast cancer Mother     Social History   Social History  . Marital Status: Married    Spouse Name: N/A  . Number of Children: N/A  . Years of Education: N/A   Social History Main Topics  . Smoking status: Never Smoker   . Smokeless tobacco: Never Used  . Alcohol Use: Yes     Comment: occasional wine  . Drug Use: No  . Sexual  Activity: Yes   Other Topics Concern  . 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  calcium-vitamin D (OSCAL WITH D) 500-200 MG-UNIT per tablet, Take 1 tablet by mouth daily. Takes on and off, Disp: , Rfl:  .  ciclopirox (PENLAC) 8 % solution, USE ON NAIL & SURROUNDING SKIN AT BED.USE DAILY OVER PREVIOUS COAT X7DAYS. REMOVE W/ALCOHOL. RESTART, Disp: 6.6 mL, Rfl: 0 .  ergocalciferol (VITAMIN D2) 50000 UNITS capsule, Take 50,000 Units by mouth once a week., Disp: , Rfl:  .  estradiol (ESTRACE) 0.1 MG/GM vaginal cream, Place 1 Applicatorful vaginally as needed., Disp: , Rfl:  .  estradiol (VIVELLE-DOT) 0.0375 MG/24HR, Place 1 patch onto the skin once a week. Apply once a week, Disp: , Rfl:  .  fluticasone (FLONASE) 50 MCG/ACT nasal spray, Place into  both nostrils daily., Disp: , Rfl:  .  latanoprost (XALATAN) 0.005 % ophthalmic solution, Place 1 drop into both eyes at bedtime.  , Disp: , Rfl:  .  loratadine (CLARITIN) 10 MG tablet, Take 10 mg by mouth daily as needed. , Disp: , Rfl:   EXAM:  Filed Vitals:   03/03/15 0923  BP: 92/70  Pulse: 75  Temp: 97.8 F (36.6 C)    Body mass index is 24.33 kg/(m^2).  GENERAL: vitals reviewed and listed above, alert, oriented, appears well hydrated and in no acute distress  HEENT: atraumatic, conjunttiva clear, no obvious abnormalities on inspection of external nose and ears, normal appearance of ear canals and TMs, clear nasal congestion, mild post oropharyngeal erythema with PND, no tonsillar edema or exudate, no sinus TTP  NECK: no obvious masses on inspection  LUNGS: clear to auscultation bilaterally, no wheezes, rales or rhonchi,  good air movement  CV: HRRR, no peripheral edema  ABD: BS+, soft, TTP in LLQ, no rebound or guarding  MS: moves all extremities without noticeable abnormality  PSYCH: pleasant and cooperative, no obvious depression or anxiety  ASSESSMENT AND PLAN:  Discussed the following assessment and plan:  Left lower quadrant pain - Plan: CMP, CBC with Differential, CT Abdomen Pelvis W Contrast  Malaise  -query viral vs other and with focal TTP/pain in LLQ in area of colon query mild colitis or diverticulitis -vs other -opted for labs, urine and CT abd pelvis w/ -Patient advised to return or notify a doctor immediately if symptoms worsen or persist or new concerns arise.  Patient Instructions  BEFORE YOU LEAVE: -appointment detail for CT scan -labs       Erica Kramer, Erica Soho R.

## 2015-03-03 NOTE — Telephone Encounter (Signed)
Stacy from CT called stating that the patient results are up and that the patient has called their office stating that she is having stomach pain.

## 2015-03-03 NOTE — Telephone Encounter (Signed)
Please let patient know CT looked good without acute findings. No Diverticulitis. May be mildly constipated and would suggest mirilax once daiy for 5-7 days and follow up if any symptoms persist or worsen.

## 2015-03-03 NOTE — Progress Notes (Signed)
Pre visit review using our clinic review tool, if applicable. No additional management support is needed unless otherwise documented below in the visit note. 

## 2015-04-08 ENCOUNTER — Ambulatory Visit
Admission: RE | Admit: 2015-04-08 | Discharge: 2015-04-08 | Disposition: A | Discharge status: Home / Self Care | Payer: BLUE CROSS/BLUE SHIELD | Source: Ambulatory Visit

## 2015-04-08 ENCOUNTER — Ambulatory Visit
Admission: RE | Admit: 2015-04-08 | Discharge: 2015-04-08 | Disposition: A | Discharge status: Home / Self Care | Payer: BLUE CROSS/BLUE SHIELD | Source: Ambulatory Visit | Attending: Obstetrics and Gynecology | Admitting: Obstetrics and Gynecology

## 2015-04-08 DIAGNOSIS — Z1231 Encounter for screening mammogram for malignant neoplasm of breast: Secondary | ICD-10-CM

## 2015-04-08 DIAGNOSIS — Z780 Asymptomatic menopausal state: Secondary | ICD-10-CM

## 2015-04-08 DIAGNOSIS — M8580 Other specified disorders of bone density and structure, unspecified site: Secondary | ICD-10-CM

## 2015-04-12 ENCOUNTER — Other Ambulatory Visit: Payer: Self-pay | Admitting: Obstetrics and Gynecology

## 2015-04-12 DIAGNOSIS — R928 Other abnormal and inconclusive findings on diagnostic imaging of breast: Secondary | ICD-10-CM

## 2015-04-18 ENCOUNTER — Ambulatory Visit
Admission: RE | Admit: 2015-04-18 | Discharge: 2015-04-18 | Disposition: A | Discharge status: Home / Self Care | Payer: BLUE CROSS/BLUE SHIELD | Source: Ambulatory Visit | Attending: Obstetrics and Gynecology | Admitting: Obstetrics and Gynecology

## 2015-04-18 DIAGNOSIS — R928 Other abnormal and inconclusive findings on diagnostic imaging of breast: Secondary | ICD-10-CM

## 2015-10-07 ENCOUNTER — Other Ambulatory Visit: Payer: Self-pay | Admitting: Family Medicine

## 2015-10-31 ENCOUNTER — Ambulatory Visit: Payer: BLUE CROSS/BLUE SHIELD | Admitting: *Deleted

## 2015-11-07 ENCOUNTER — Ambulatory Visit (INDEPENDENT_AMBULATORY_CARE_PROVIDER_SITE_OTHER): Payer: BLUE CROSS/BLUE SHIELD | Admitting: *Deleted

## 2015-11-07 DIAGNOSIS — Z23 Encounter for immunization: Secondary | ICD-10-CM

## 2016-02-02 ENCOUNTER — Encounter: Payer: Self-pay | Admitting: Gastroenterology

## 2016-02-16 ENCOUNTER — Telehealth: Payer: Self-pay | Admitting: Family Medicine

## 2016-02-16 NOTE — Telephone Encounter (Signed)
Would advise follow up with [facility] if possible as I trust their skills/outcomes. It has been quite sometime since she has been there and she may have a completely different experience.  However, if she still feels uncomfortable about, please place referral to where she prefers to go. She will probably need appt first since this is a recall from last colonoscopy and they would need records.

## 2016-02-16 NOTE — Telephone Encounter (Signed)
I called the pt and she stated she would like to have the colonoscopy/endoscopy done somewhere else other than [facility] as she did not feel comfortable the way things were handled with the staff at Dr Lynne Leader office, not the physician himself.  She stated it was nothing terrible that happened but felt the staff was "short" with her and has been there twice and felt the same way. She is concerned that this will be reflected in her chart and reason sent to the other office.

## 2016-02-16 NOTE — Telephone Encounter (Signed)
Pt would like a call back to discuss the possibility of having her colonoscopy done somewhere other than [facility]. Pt states she has 2 questions concerning this issue. Pt would like you to try home number first.

## 2016-02-17 NOTE — Telephone Encounter (Signed)
I left a message for the pt to return my call. 

## 2016-02-20 NOTE — Telephone Encounter (Signed)
Pt is returning your phone call and can be reached for the next couple hours at [phone].

## 2016-02-20 NOTE — Telephone Encounter (Signed)
I called the pt and informed her of the message below and she stated she will think about this and call back with more information.

## 2016-03-06 ENCOUNTER — Encounter: Payer: Self-pay | Admitting: Gastroenterology

## 2016-04-09 ENCOUNTER — Ambulatory Visit (INDEPENDENT_AMBULATORY_CARE_PROVIDER_SITE_OTHER): Payer: BLUE CROSS/BLUE SHIELD | Admitting: Gastroenterology

## 2016-04-09 ENCOUNTER — Encounter: Payer: Self-pay | Admitting: Gastroenterology

## 2016-04-09 VITALS — BP 110/74 | HR 78 | Resp 18 | Ht 64.0 in | Wt 144.8 lb

## 2016-04-09 DIAGNOSIS — K219 Gastro-esophageal reflux disease without esophagitis: Secondary | ICD-10-CM

## 2016-04-09 DIAGNOSIS — Z86010 Personal history of colonic polyps: Secondary | ICD-10-CM | POA: Diagnosis not present

## 2016-04-09 MED ORDER — NA SULFATE-K SULFATE-MG SULF 17.5-3.13-1.6 GM/177ML PO SOLN: 1.0000 | Freq: Once | ORAL | 0 refills | Status: AC

## 2016-04-09 NOTE — Patient Instructions (Signed)
Patient advised to avoid spicy, acidic, citrus, chocolate, mints, fruit and fruit juices.  Limit the intake of caffeine, alcohol and Soda.  Don't exercise too soon after eating.  Don't lie down within 3-4 hours of eating.  Elevate the head of your bed.  You have been scheduled for a colonoscopy. Please follow written instructions given to you at your visit today.  Please pick up your prep supplies at the pharmacy within the next 1-3 days. If you use inhalers (even only as needed), please bring them with you on the day of your procedure. Your physician has requested that you go to www.startemmi.com and enter the access code given to you at your visit today. This web site gives a general overview about your procedure. However, you should still follow specific instructions given to you by our office regarding your preparation for the procedure.   Thank you for choosing me and [facility].  Pricilla Riffle. Dagoberto Ligas., MD., Marval Regal

## 2016-04-09 NOTE — Progress Notes (Signed)
History of Present Illness: This is a 61 year old female here for evaluation of GERD and a personal history of colon polyps. She is due for a five-year interval surveillance colonoscopy. She relates many years of intermittent reflux problem she states that about 3 or 4 times the year she will have heartburn symptoms that may last 3 or 4 days and are controlled with the use of Pepcid before meals. She's not had any reflux symptoms or use Pepcid AC in several months. Her father had Barrett's esophagus. Denies weight loss, abdominal pain, constipation, diarrhea, change in stool caliber, melena, hematochezia, nausea, vomiting, dysphagia, chest pain.     No Known Allergies Outpatient Medications Prior to Visit  Medication Sig Dispense Refill  . calcium-vitamin D (OSCAL WITH D) 500-200 MG-UNIT per tablet Take 1 tablet by mouth daily. Takes on and off    . ciclopirox (PENLAC) 8 % solution USE ON NAIL & SURROUNDING SKIN AT BED. USE DAILY OVER PREVIOUS COAT X7DAYS, REMOVE W/ALCOHOL RESTART 6.6 mL 0  . ergocalciferol (VITAMIN D2) 50000 UNITS capsule Take 50,000 Units by mouth once a week.    . estradiol (ESTRACE) 0.1 MG/GM vaginal cream Place 1 Applicatorful vaginally as needed.    Marland Kitchen estradiol (VIVELLE-DOT) 0.0375 MG/24HR Place 1 patch onto the skin once a week. Apply once a week    . fluticasone (FLONASE) 50 MCG/ACT nasal spray Place into both nostrils daily.    Marland Kitchen latanoprost (XALATAN) 0.005 % ophthalmic solution Place 1 drop into both eyes at bedtime.      Marland Kitchen loratadine (CLARITIN) 10 MG tablet Take 10 mg by mouth daily as needed.      No facility-administered medications prior to visit.    Past Medical History:  Diagnosis Date  . Anxiety   . Chronic frontal sinusitis 01/28/2007   Qualifier: Diagnosis of  By: Arnoldo Morale MD, [place], ADENOMATOUS, HX OF 08/30/2008   Qualifier: Diagnosis of  By: Harlon Ditty CMA (AAMA), Dottie    . Diverticulosis, acute   . DJD (degenerative joint  disease)   . GERD 09/02/2008   Qualifier: Diagnosis of  By: Fuller Plan MD Marijo Conception Glaucoma   . Headache(784.0)   . HEMATOCHEZIA 09/02/2008   Qualifier: Diagnosis of  By: Fuller Plan MD Marijo Conception Macular degeneration syndrome   . MONILIASIS, SKIN/NAILS 01/28/2007   Qualifier: Diagnosis of  By: Arnoldo Morale MD, John E   . Neck pain, chronic   . PONV (postoperative nausea and vomiting)   . Rosacea   . TMJ (temporomandibular joint disorder)    Past Surgical History:  Procedure Laterality Date  . ABDOMINAL HYSTERECTOMY    . COLONOSCOPY    . LAPAROSCOPIC APPENDECTOMY  03/12/2011   Procedure: APPENDECTOMY LAPAROSCOPIC;  Surgeon: Merrie Roof, MD;  Location: [facility];  Service: General;  Laterality: N/A;  . POLYPECTOMY    . TONSILLECTOMY     Social History   Social History  . Marital status: Married    Spouse name: N/A  . Number of children: N/A  . Years of education: N/A   Social History Main Topics  . Smoking status: Never Smoker  . Smokeless tobacco: Never Used  . Alcohol use Yes     Comment: occasional wine  . Drug use: No  . Sexual activity: Yes   Other Topics Concern  . 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 Heart disease    . Cancer    . Diabetes    . Breast cancer Mother       Physical Exam: General: Well developed, well nourished, no acute distress Head: Normocephalic and atraumatic Eyes:  sclerae anicteric, EOMI Ears: Normal auditory acuity Mouth: No deformity or lesions Lungs: Clear throughout to auscultation Heart: Regular rate and rhythm; no murmurs, rubs or bruits Abdomen: Soft, non tender and non distended. No masses,  hepatosplenomegaly or hernias noted. Normal Bowel sounds Rectal: deferred to colonoscopy Musculoskeletal: Symmetrical with no gross deformities  Pulses:  Normal pulses noted Extremities: No clubbing, cyanosis, edema or deformities noted Neurological: Alert oriented x 4, grossly nonfocal Psychological:  Alert and cooperative. Normal mood and affect  Assessment and Recommendations:  1. Personal history of adenomatous colon polyps. Schedule colonoscopy. The risks (including bleeding, perforation, infection, missed lesions, medication reactions and possible hospitalization or surgery if complications occur), benefits, and alternatives to colonoscopy with possible biopsy and possible polypectomy were discussed with the patient and they consent to proceed.   2. GERD. Pepcid as qd prn. Antireflux measures. Offered her the option of EGD to further evaluate her recurrent GERD type symptoms. She states she will think about this and consider it later date.

## 2016-04-10 ENCOUNTER — Other Ambulatory Visit: Payer: Self-pay | Admitting: Family Medicine

## 2016-04-10 DIAGNOSIS — Z1231 Encounter for screening mammogram for malignant neoplasm of breast: Secondary | ICD-10-CM

## 2016-04-18 ENCOUNTER — Telehealth: Payer: Self-pay | Admitting: Gastroenterology

## 2016-04-18 NOTE — Telephone Encounter (Signed)
Patient states she spoke with pharmacy and told her she can print out a coupon online for [employer]. States she will do that and if she has any other problems will call back.

## 2016-05-01 ENCOUNTER — Ambulatory Visit
Admission: RE | Admit: 2016-05-01 | Discharge: 2016-05-01 | Disposition: A | Discharge status: Home / Self Care | Payer: BLUE CROSS/BLUE SHIELD | Source: Ambulatory Visit | Attending: Family Medicine | Admitting: Family Medicine

## 2016-05-01 DIAGNOSIS — Z1231 Encounter for screening mammogram for malignant neoplasm of breast: Secondary | ICD-10-CM

## 2016-05-08 ENCOUNTER — Encounter: Payer: Self-pay | Admitting: Gastroenterology

## 2016-05-18 ENCOUNTER — Encounter: Payer: Self-pay | Admitting: Gastroenterology

## 2016-05-18 ENCOUNTER — Ambulatory Visit (AMBULATORY_SURGERY_CENTER): Payer: BLUE CROSS/BLUE SHIELD | Admitting: Gastroenterology

## 2016-05-18 VITALS — BP 117/75 | HR 64 | Temp 98.6°F | Resp 14 | Ht 64.0 in | Wt 144.0 lb

## 2016-05-18 DIAGNOSIS — D125 Benign neoplasm of sigmoid colon: Secondary | ICD-10-CM

## 2016-05-18 DIAGNOSIS — D120 Benign neoplasm of cecum: Secondary | ICD-10-CM

## 2016-05-18 DIAGNOSIS — D123 Benign neoplasm of transverse colon: Secondary | ICD-10-CM

## 2016-05-18 DIAGNOSIS — Z86010 Personal history of colonic polyps: Secondary | ICD-10-CM | POA: Diagnosis present

## 2016-05-18 MED ORDER — SODIUM CHLORIDE 0.9 % IV SOLN: 500.0000 mL | INTRAVENOUS | Status: DC

## 2016-05-18 NOTE — Progress Notes (Signed)
Called to room to assist during endoscopic procedure.  Patient ID and intended procedure confirmed with present staff. Received instructions for my participation in the procedure from the performing physician.  

## 2016-05-18 NOTE — Op Note (Signed)
[facility] Patient Name: Erica Kramer Procedure Date: 05/18/2016 2:12 PM MRN: [id number] Endoscopist: Ladene Artist , MD Age: 61 Referring MD:  Date of Birth: 01-26-1955 Gender: Female Account #: [id number] Procedure:                Colonoscopy Indications:              Surveillance: Personal history of adenomatous                            polyps on last colonoscopy 5 years ago Medicines:                Monitored Anesthesia Care Procedure:                Pre-Anesthesia Assessment:                           - Prior to the procedure, a History and Physical                            was performed, and patient medications and                            allergies were reviewed. The patient's tolerance of                            previous anesthesia was also reviewed. The risks                            and benefits of the procedure and the sedation                            options and risks were discussed with the patient.                            All questions were answered, and informed consent                            was obtained. Prior Anticoagulants: The patient has                            taken no previous anticoagulant or antiplatelet                            agents. ASA Grade Assessment: II - A patient with                            mild systemic disease. After reviewing the risks                            and benefits, the patient was deemed in                            satisfactory condition to undergo the procedure.  After obtaining informed consent, the colonoscope                            was passed under direct vision. Throughout the                            procedure, the patient's blood pressure, pulse, and                            oxygen saturations were monitored continuously. The                            Colonoscope was introduced through the anus and                            advanced to the the cecum,  identified by                            appendiceal orifice and ileocecal valve. The                            appendiceal orifice, and rectum were photographed.                            The IC valve photo did not capture. The quality of                            the bowel preparation was adequate with extensive                            lavage and suctioning. The colonoscopy was                            performed without difficulty. The patient tolerated                            the procedure well. Scope In: 2:23:06 PM Scope Out: 2:39:12 PM Scope Withdrawal Time: 0 hours 13 minutes 35 seconds  Total Procedure Duration: 0 hours 16 minutes 6 seconds  Findings:                 The perianal and digital rectal examinations were                            normal.                           Four sessile polyps were found in the sigmoid colon                            (1), transverse colon (2) and cecum (1). The polyps                            were 5 to 8 mm in size. These polyps were removed  with a cold snare. Resection and retrieval were                            complete.                           Many small-mouthed diverticula were found in the                            sigmoid colon. There was narrowing of the colon in                            association with the diverticular opening. There                            was evidence of diverticular spasm. There was no                            evidence of diverticular bleeding.                           The exam was otherwise without abnormality on                            direct and retroflexion views. Complications:            No immediate complications. Estimated blood loss:                            None. Estimated Blood Loss:     Estimated blood loss: none. Impression:               - Four 5 to 8 mm polyps in the sigmoid colon, in                            the transverse colon and in the  cecum, removed with                            a cold snare. Resected and retrieved except the 46mm                            sigmoid colon polyp was not retrieved.                           - Mild diverticulosis in the sigmoid colon.                           - The examination was otherwise normal on direct                            and retroflexion views. Recommendation:           - Repeat colonoscopy in 3 - 5 years for  surveillance pending path review with a more                            extensive bowel prep.                           - Patient has a contact number available for                            emergencies. The signs and symptoms of potential                            delayed complications were discussed with the                            patient. Return to normal activities tomorrow.                            Written discharge instructions were provided to the                            patient.                           - Resume previous diet.                           - Continue present medications.                           - Await pathology results. Ladene Artist, MD 05/18/2016 2:49:33 PM This report has been signed electronically.

## 2016-05-18 NOTE — Progress Notes (Signed)
No egg or soy allergy known to patient   issues with past sedation with any surgeries  or procedures of PONV , no intubation problems  No diet pills per patient No home 02 use per patient  No blood thinners per patient  Pt denies issues with constipation  No A fib or A flutter   

## 2016-05-18 NOTE — Patient Instructions (Signed)
Impression/Recommendations:  Polyp handout given to patient. Diverticulosis handout given to patient.  Repeat colonoscopy in 3-5 years for surveillance pending pathology review.  Resume previous diet. Continue present medications.  YOU HAD AN ENDOSCOPIC PROCEDURE TODAY AT [facility]:   Refer to the procedure report that was given to you for any specific questions about what was found during the examination.  If the procedure report does not answer your questions, please call your gastroenterologist to clarify.  If you requested that your care partner not be given the details of your procedure findings, then the procedure report has been included in a sealed envelope for you to review at your convenience later.  YOU SHOULD EXPECT: Some feelings of bloating in the abdomen. Passage of more gas than usual.  Walking can help get rid of the air that was put into your GI tract during the procedure and reduce the bloating. If you had a lower endoscopy (such as a colonoscopy or flexible sigmoidoscopy) you may notice spotting of blood in your stool or on the toilet paper. If you underwent a bowel prep for your procedure, you may not have a normal bowel movement for a few days.  Please Note:  You might notice some irritation and congestion in your nose or some drainage.  This is from the oxygen used during your procedure.  There is no need for concern and it should clear up in a day or so.  SYMPTOMS TO REPORT IMMEDIATELY:   Following lower endoscopy (colonoscopy or flexible sigmoidoscopy):  Excessive amounts of blood in the stool  Significant tenderness or worsening of abdominal pains  Swelling of the abdomen that is new, acute  Fever of 100F or higher  For urgent or emergent issues, a gastroenterologist can be reached at any hour by calling [phone].   DIET:  We do recommend a small meal at first, but then you may proceed to your regular diet.  Drink plenty of fluids but  you should avoid alcoholic beverages for 24 hours.  ACTIVITY:  You should plan to take it easy for the rest of today and you should NOT DRIVE or use heavy machinery until tomorrow (because of the sedation medicines used during the test).    FOLLOW UP: Our staff will call the number listed on your records the next business day following your procedure to check on you and address any questions or concerns that you may have regarding the information given to you following your procedure. If we do not reach you, we will leave a message.  However, if you are feeling well and you are not experiencing any problems, there is no need to return our call.  We will assume that you have returned to your regular daily activities without incident.  If any biopsies were taken you will be contacted by phone or by letter within the next 1-3 weeks.  Please call us at [phone] if you have not heard about the biopsies in 3 weeks.    SIGNATURES/CONFIDENTIALITY: You and/or your care partner have signed paperwork which will be entered into your electronic medical record.  These signatures attest to the fact that that the information above on your After Visit Summary has been reviewed and is understood.  Full responsibility of the confidentiality of this discharge information lies with you and/or your care-partner.

## 2016-05-18 NOTE — Progress Notes (Signed)
Report to PACU, RN, vss, BBS= Clear.  

## 2016-05-21 ENCOUNTER — Telehealth: Payer: Self-pay

## 2016-05-21 NOTE — Telephone Encounter (Signed)
Left a message at [phone] to let pt know we called to check on her. ma

## 2016-05-21 NOTE — Telephone Encounter (Signed)
  Follow up Call-  Call back number 05/18/2016  Post procedure Call Back phone  # [phone]  Permission to leave phone message Yes  Some recent data might be hidden     Patient questions:  Do you have a fever, pain , or abdominal swelling? No. Pain Score  0 *  Have you tolerated food without any problems? Yes.    Have you been able to return to your normal activities? Yes.    Do you have any questions about your discharge instructions: Diet              No. Medications  No. Follow up visit  No.  Do you have questions or concerns about your Care? Yes.     Patient refers to her discharge instructions that say it may be a day or two before she has a bowel movement post colonoscopy. She states that it is not unusual for her to go 3 days between bowel movements and asks when she should be concerned if she doesn't have one. Patient states she feels well and is eating without difficulty at this time. Patient instructed to follow a high fiber diet and drink plenty of fluids. Instructed patient to call office if she develops any abdominal pain or discomfort and/or hasn't had a  bowel movement within a day or two past that of her normal bowel schedule. Patient verbalizes understanding.  Actions: * If pain score is 4 or above: No action needed, pain <4.

## 2016-05-31 ENCOUNTER — Encounter: Payer: Self-pay | Admitting: Gastroenterology

## 2016-06-12 ENCOUNTER — Other Ambulatory Visit: Payer: Self-pay | Admitting: Family Medicine

## 2016-06-19 ENCOUNTER — Ambulatory Visit (INDEPENDENT_AMBULATORY_CARE_PROVIDER_SITE_OTHER): Payer: BLUE CROSS/BLUE SHIELD | Admitting: Family Medicine

## 2016-06-19 ENCOUNTER — Encounter: Payer: Self-pay | Admitting: Family Medicine

## 2016-06-19 VITALS — BP 110/80 | HR 84 | Temp 97.8°F | Wt 134.6 lb

## 2016-06-19 DIAGNOSIS — H6123 Impacted cerumen, bilateral: Secondary | ICD-10-CM | POA: Diagnosis not present

## 2016-06-19 MED ORDER — MOMETASONE FUROATE 0.1 % EX SOLN: Freq: Every day | CUTANEOUS | 0 refills | Status: DC

## 2016-06-19 NOTE — Patient Instructions (Signed)
WE NOW OFFER   Erica Kramer's FAST TRACK!!!  SAME DAY Appointments for ACUTE CARE  Such as: Sprains, Injuries, cuts, abrasions, rashes, muscle pain, joint pain, back pain Colds, flu, sore throats, headache, allergies, cough, fever  Ear pain, sinus and eye infections Abdominal pain, nausea, vomiting, diarrhea, upset stomach Animal/insect bites  3 Easy Ways to Schedule: Walk-In Scheduling Call in scheduling Mychart Sign-up: [email or web address]         

## 2016-06-19 NOTE — Progress Notes (Signed)
Subjective:     Patient ID: Erica Kramer, female   DOB: 1955/12/15, 61 y.o.   MRN: [id number]  HPI Patient seen with bilateral irritation. She states a few weeks ago she had some itching mostly on the left followed by some irritation deep in the canal. She's had some nasal congestive symptoms and using Flonase. She had some recent left ear discomfort not improved with Flonase. She's not had any vertigo. No hearing loss. No ear drainage. No exacerbating or alleviating factors.  Past Medical History:  Diagnosis Date  . Allergy    mild  . Anemia    as child only  . Anxiety    no medicines  . Chronic frontal sinusitis 01/28/2007   Qualifier: Diagnosis of  By: Arnoldo Morale MD, Isabel, ADENOMATOUS, HX OF 08/30/2008   Qualifier: Diagnosis of  By: Harlon Ditty CMA (AAMA), Dottie    . Constipation    diet controlled mainly  . Diverticulosis, acute   . DJD (degenerative joint disease)   . GERD 09/02/2008   Qualifier: Diagnosis of  By: Fuller Plan MD Marijo Conception Glaucoma   . Headache(784.0)   . HEMATOCHEZIA 09/02/2008   Qualifier: Diagnosis of  By: Fuller Plan MD Marijo Conception Macular degeneration syndrome   . MONILIASIS, SKIN/NAILS 01/28/2007   Qualifier: Diagnosis of  By: Arnoldo Morale MD, John E   . Neck pain, chronic   . Osteopenia   . PONV (postoperative nausea and vomiting)   . Rosacea   . TMJ (temporomandibular joint disorder)    Past Surgical History:  Procedure Laterality Date  . ABDOMINAL HYSTERECTOMY    . APPENDECTOMY    . COLONOSCOPY    . LAPAROSCOPIC APPENDECTOMY  03/12/2011   Procedure: APPENDECTOMY LAPAROSCOPIC;  Surgeon: Merrie Roof, MD;  Location: [facility];  Service: General;  Laterality: N/A;  . POLYPECTOMY    . TONSILLECTOMY      reports that she has never smoked. She has never used smokeless tobacco. She reports that she drinks alcohol. She reports that she does not use drugs. family history includes Barrett's esophagus in her father; Breast cancer in her  maternal aunt and mother; Colon polyps in her father. No Known Allergies   Review of Systems  HENT: Positive for ear pain. Negative for congestion, ear discharge and hearing loss.        Objective:   Physical Exam  Constitutional: She appears well-developed and well-nourished.  HENT:  Patient has cerumen impactions bilaterally. These were removed with curette without difficulty. Her eardrums appear normal. Ear canals appear normal-with exception of some very mild scaling.  No changes of otitis externa.       Assessment:     Bilateral cerumen impactions. No evidence for otitis media or otitis externa.  Mild eczema changes of canal.    Plan:     -Cerumen removed as above-with curette.   -Elocon lotion once daily as needed for any persistent pruritus  Eulas Post MD [facility]

## 2016-08-07 ENCOUNTER — Other Ambulatory Visit: Payer: Self-pay | Admitting: Dermatology

## 2016-08-17 ENCOUNTER — Ambulatory Visit (INDEPENDENT_AMBULATORY_CARE_PROVIDER_SITE_OTHER): Payer: BLUE CROSS/BLUE SHIELD | Admitting: Family Medicine

## 2016-08-17 ENCOUNTER — Encounter: Payer: Self-pay | Admitting: Family Medicine

## 2016-08-17 VITALS — BP 102/78 | HR 96 | Temp 98.2°F | Ht 64.0 in | Wt 125.8 lb

## 2016-08-17 DIAGNOSIS — H9390 Unspecified disorder of ear, unspecified ear: Secondary | ICD-10-CM

## 2016-08-17 MED ORDER — VALACYCLOVIR HCL 1 G PO TABS: 1000.0000 mg | ORAL_TABLET | Freq: Three times a day (TID) | ORAL | 0 refills | Status: DC

## 2016-08-17 NOTE — Progress Notes (Signed)
.  HPI:   Acute visit for blister on the L ear: -noticed today -had some pain here for a few days -reports several episodes of the same in the past remotely many years ago and was told was likely shingles but she never saw a doctor when it occurred -she is worried if it could be herpes from her x husband -no fevers, nausea, vomiting, vision changes, hearing changes, lesions elsewhere  ROS: See pertinent positives and negatives per HPI.  Past Medical History:  Diagnosis Date  . Allergy    mild  . Anemia    as child only  . Anxiety    no medicines  . Chronic frontal sinusitis 01/28/2007   Qualifier: Diagnosis of  By: Arnoldo Morale MD, [place], ADENOMATOUS, HX OF 08/30/2008   Qualifier: Diagnosis of  By: Harlon Ditty CMA (AAMA), Dottie    . Constipation    diet controlled mainly  . Diverticulosis, acute   . DJD (degenerative joint disease)   . GERD 09/02/2008   Qualifier: Diagnosis of  By: Fuller Plan MD Marijo Conception Glaucoma   . Headache(784.0)   . HEMATOCHEZIA 09/02/2008   Qualifier: Diagnosis of  By: Fuller Plan MD Marijo Conception Macular degeneration syndrome   . MONILIASIS, SKIN/NAILS 01/28/2007   Qualifier: Diagnosis of  By: Arnoldo Morale MD, John E   . Neck pain, chronic   . Osteopenia   . PONV (postoperative nausea and vomiting)   . Rosacea   . TMJ (temporomandibular joint disorder)     Past Surgical History:  Procedure Laterality Date  . ABDOMINAL HYSTERECTOMY    . APPENDECTOMY    . COLONOSCOPY    . LAPAROSCOPIC APPENDECTOMY  03/12/2011   Procedure: APPENDECTOMY LAPAROSCOPIC;  Surgeon: Merrie Roof, MD;  Location: [facility];  Service: General;  Laterality: N/A;  . POLYPECTOMY    . TONSILLECTOMY      Family History  Problem Relation Age of Onset  . Breast cancer Mother   . Colon polyps Father   . Barrett's esophagus Father   . Heart disease Unknown   . Cancer Unknown   . Colon cancer Unknown   . Diabetes Unknown   . Breast cancer Maternal Aunt   .  Rectal cancer Neg Hx   . Stomach cancer Neg Hx   . Esophageal cancer Neg Hx     Social History   Social History  . Marital status: Married    Spouse name: N/A  . Number of children: N/A  . Years of education: N/A   Social History Main Topics  . Smoking status: Never Smoker  . Smokeless tobacco: Never Used  . Alcohol use Yes     Comment: occasional wine  . Drug use: No  . Sexual activity: Yes   Other Topics Concern  . 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  calcium-vitamin D (OSCAL WITH D) 500-200 MG-UNIT per tablet, Take 1 tablet by mouth daily. Takes on and off, Disp: , Rfl:  .  ciclopirox (PENLAC) 8 % solution,  USE ON NAIL & SURROUNDING SKIN AT BED. USE DAILY OVER PREVIOUS COAT X7DAYS, REMOVE W/ALCOHOL RESTART, Disp: 6.6 mL, Rfl: 0 .  clindamycin (CLEOCIN) 2 % vaginal cream, INSERT 1 APPLICATORFUL AT BEDTIME, Disp: , Rfl: 0 .  ergocalciferol (VITAMIN D2) 50000 UNITS capsule, Take 50,000 Units by mouth once a week., Disp: , Rfl:  .  estradiol (ESTRACE) 0.1 MG/GM vaginal cream, Place 1 Applicatorful vaginally as needed., Disp: , Rfl:  .  estradiol (VIVELLE-DOT) 0.0375 MG/24HR, Place 1 patch onto the skin once a week. Apply once a week, Disp: , Rfl:  .  fluticasone (FLONASE) 50 MCG/ACT nasal spray, Place into both nostrils daily., Disp: , Rfl:  .  latanoprost (XALATAN) 0.005 % ophthalmic solution, Place 1 drop into both eyes at bedtime.  , Disp: , Rfl:  .  loratadine (CLARITIN) 10 MG tablet, Take 10 mg by mouth daily as needed. , Disp: , Rfl:  .  mometasone (ELOCON) 0.1 % lotion, Apply topically daily., Disp: 60 mL, Rfl: 0 .  nystatin-triamcinolone ointment  (MYCOLOG), APPLY TO AFFECTED AREA TWICE A DAY, Disp: , Rfl: 0 .  valACYclovir (VALTREX) 1000 MG tablet, Take 1 tablet (1,000 mg total) by mouth 3 (three) times daily., Disp: 21 tablet, Rfl: 0  Current Facility-Administered Medications:  .  0.9 %  sodium chloride infusion, 500 mL, Intravenous, Continuous, Ladene Artist, MD  EXAM:  Vitals:   08/17/16 1353  BP: 102/78  Pulse: 96  Temp: 98.2 F (36.8 C)    Body mass index is 21.59 kg/m.  GENERAL: vitals reviewed and listed above, alert, oriented, appears well hydrated and in no acute distress  HEENT: atraumatic, conjunttiva clear, no obvious abnormalities on inspection of external nose and ears except for single vesicular lesion on the L ear, normal appearance of ear canals and TMs, clear nasal congestion, mild post oropharyngeal erythema with PND, no tonsillar edema or exudate, no sinus TTP  NECK: no obvious masses on inspection  LUNGS: clear to auscultation bilaterally, no wheezes, rales or rhonchi, good air movement  CV: HRRR, no peripheral edema  MS: moves all extremities without noticeable abnormality  PSYCH: pleasant and cooperative, no obvious depression or anxiety  ASSESSMENT AND PLAN:  Discussed the following assessment and plan:  Ear lesion - Plan: Viral culture, Viral culture -we discussed possible serious and likely etiologies, workup and treatment, treatment risks and return precautions; likely shingles -after this discussion, Cerena opted for viral culture, valtrex -follow up advised as needed -of course, we advised Daine  to return or notify a doctor immediately if symptoms worsen or persist or new concerns arise.   Patient Instructions  We have ordered labs or studies at this visit. It can take up to 1-2 weeks for results and processing. IF results require follow up or explanation, we will call you with instructions. Clinically stable results will be released to your [facility]. If you have not heard from [place] or  cannot find your results in [facility] in 2 weeks please contact our office at [phone].  If you are not yet signed up for [facility], please consider signing up.  Start the Valtrex today and take as instructed.   Shingles Shingles, which is also known as herpes zoster, is an infection that causes a painful skin rash and fluid-filled blisters. Shingles is not related to genital herpes, which is a sexually transmitted infection. Shingles only develops in people who:  Have had chickenpox.  Have received the chickenpox vaccine. (This is rare.)  What are  the causes? Shingles is caused by varicella-zoster virus (VZV). This is the same virus that causes chickenpox. After exposure to VZV, the virus stays in the body in an inactive (dormant) state. Shingles develops if the virus reactivates. This can happen many years after the initial exposure to VZV. It is not known what causes this virus to reactivate. What increases the risk? People who have had chickenpox or received the chickenpox vaccine are at risk for shingles. Infection is more common in people who:  Are older than age 73.  Have a weakened defense (immune) system, such as those with HIV, AIDS, or cancer.  Are taking medicines that weaken the immune system, such as transplant medicines.  Are under great stress.  What are the signs or symptoms? Early symptoms of this condition include itching, tingling, and pain in an area on your skin. Pain may be described as burning, stabbing, or throbbing. A few days or weeks after symptoms start, a painful red rash appears, usually on one side of the body in a bandlike or beltlike pattern. The rash eventually turns into fluid-filled blisters that break open, scab over, and dry up in about 2-3 weeks. At any time during the infection, you may also develop:  A fever.  Chills.  A headache.  An upset stomach.  How is this diagnosed? This condition is diagnosed with a skin exam. Sometimes, skin  or fluid samples are taken from the blisters before a diagnosis is made. These samples are examined under a microscope or sent to a lab for testing. How is this treated? There is no specific cure for this condition. Your health care provider will probably prescribe medicines to help you manage pain, recover more quickly, and avoid long-term problems. Medicines may include:  Antiviral drugs.  Anti-inflammatory drugs.  Pain medicines.  If the area involved is on your face, you may be referred to a specialist, such as an eye doctor (ophthalmologist) or an ear, nose, and throat (ENT) doctor to help you avoid eye problems, chronic pain, or disability. Follow these instructions at home: Medicines  Take medicines only as directed by your health care provider.  Apply an anti-itch or numbing cream to the affected area as directed by your health care provider. Blister and Rash Care  Take a cool bath or apply cool compresses to the area of the rash or blisters as directed by your health care provider. This may help with pain and itching.  Keep your rash covered with a loose bandage (dressing). Wear loose-fitting clothing to help ease the pain of material rubbing against the rash.  Keep your rash and blisters clean with mild soap and cool water or as directed by your health care provider.  Check your rash every day for signs of infection. These include redness, swelling, and pain that lasts or increases.  Do not pick your blisters.  Do not scratch your rash. General instructions  Rest as directed by your health care provider.  Keep all follow-up visits as directed by your health care provider. This is important.  Until your blisters scab over, your infection can cause chickenpox in people who have never had it or been vaccinated against it. To prevent this from happening, avoid contact with other people, especially: ? Babies. ? Pregnant women. ? Children who have eczema. ? Elderly people  who have transplants. ? People who have chronic illnesses, such as leukemia or AIDS. Contact a health care provider if:  Your pain is not relieved with prescribed medicines.  Your pain does not get better after the rash heals.  Your rash looks infected. Signs of infection include redness, swelling, and pain that lasts or increases. Get help right away if:  The rash is on your face or nose.  You have facial pain, pain around your eye area, or loss of feeling on one side of your face.  You have ear pain or you have ringing in your ear.  You have loss of taste.  Your condition gets worse. This information is not intended to replace advice given to you by your health care provider. Make sure you discuss any questions you have with your health care provider. Document Released: 01/08/2005 Document Revised: 09/04/2015 Document Reviewed: 11/19/2013 Elsevier Interactive Patient Education  2017 [place]., DO

## 2016-08-17 NOTE — Patient Instructions (Signed)
We have ordered labs or studies at this visit. It can take up to 1-2 weeks for results and processing. IF results require follow up or explanation, we will call you with instructions. Clinically stable results will be released to your [facility]. If you have not heard from [place] or cannot find your results in [facility] in 2 weeks please contact our office at [phone].  If you are not yet signed up for [facility], please consider signing up.  Start the Valtrex today and take as instructed.   Shingles Shingles, which is also known as herpes zoster, is an infection that causes a painful skin rash and fluid-filled blisters. Shingles is not related to genital herpes, which is a sexually transmitted infection. Shingles only develops in people who:  Have had chickenpox.  Have received the chickenpox vaccine. (This is rare.)  What are the causes? Shingles is caused by varicella-zoster virus (VZV). This is the same virus that causes chickenpox. After exposure to VZV, the virus stays in the body in an inactive (dormant) state. Shingles develops if the virus reactivates. This can happen many years after the initial exposure to VZV. It is not known what causes this virus to reactivate. What increases the risk? People who have had chickenpox or received the chickenpox vaccine are at risk for shingles. Infection is more common in people who:  Are older than age 13.  Have a weakened defense (immune) system, such as those with HIV, AIDS, or cancer.  Are taking medicines that weaken the immune system, such as transplant medicines.  Are under great stress.  What are the signs or symptoms? Early symptoms of this condition include itching, tingling, and pain in an area on your skin. Pain may be described as burning, stabbing, or throbbing. A few days or weeks after symptoms start, a painful red rash appears, usually on one side of the body in a bandlike or beltlike pattern. The rash eventually turns into  fluid-filled blisters that break open, scab over, and dry up in about 2-3 weeks. At any time during the infection, you may also develop:  A fever.  Chills.  A headache.  An upset stomach.  How is this diagnosed? This condition is diagnosed with a skin exam. Sometimes, skin or fluid samples are taken from the blisters before a diagnosis is made. These samples are examined under a microscope or sent to a lab for testing. How is this treated? There is no specific cure for this condition. Your health care provider will probably prescribe medicines to help you manage pain, recover more quickly, and avoid long-term problems. Medicines may include:  Antiviral drugs.  Anti-inflammatory drugs.  Pain medicines.  If the area involved is on your face, you may be referred to a specialist, such as an eye doctor (ophthalmologist) or an ear, nose, and throat (ENT) doctor to help you avoid eye problems, chronic pain, or disability. Follow these instructions at home: Medicines  Take medicines only as directed by your health care provider.  Apply an anti-itch or numbing cream to the affected area as directed by your health care provider. Blister and Rash Care  Take a cool bath or apply cool compresses to the area of the rash or blisters as directed by your health care provider. This may help with pain and itching.  Keep your rash covered with a loose bandage (dressing). Wear loose-fitting clothing to help ease the pain of material rubbing against the rash.  Keep your rash and blisters clean with mild soap  and cool water or as directed by your health care provider.  Check your rash every day for signs of infection. These include redness, swelling, and pain that lasts or increases.  Do not pick your blisters.  Do not scratch your rash. General instructions  Rest as directed by your health care provider.  Keep all follow-up visits as directed by your health care provider. This is  important.  Until your blisters scab over, your infection can cause chickenpox in people who have never had it or been vaccinated against it. To prevent this from happening, avoid contact with other people, especially: ? Babies. ? Pregnant women. ? Children who have eczema. ? Elderly people who have transplants. ? People who have chronic illnesses, such as leukemia or AIDS. Contact a health care provider if:  Your pain is not relieved with prescribed medicines.  Your pain does not get better after the rash heals.  Your rash looks infected. Signs of infection include redness, swelling, and pain that lasts or increases. Get help right away if:  The rash is on your face or nose.  You have facial pain, pain around your eye area, or loss of feeling on one side of your face.  You have ear pain or you have ringing in your ear.  You have loss of taste.  Your condition gets worse. This information is not intended to replace advice given to you by your health care provider. Make sure you discuss any questions you have with your health care provider. Document Released: 01/08/2005 Document Revised: 09/04/2015 Document Reviewed: 11/19/2013 Elsevier Interactive Patient Education  2017 [employer].

## 2016-08-23 LAB — RFX HSV/VARICELLA ZOSTER RAPID CULT

## 2016-08-23 LAB — REFLEX ADENOVIRUS CULTURE

## 2016-08-24 ENCOUNTER — Telehealth: Payer: Self-pay

## 2016-08-24 ENCOUNTER — Other Ambulatory Visit: Payer: Self-pay | Admitting: Family Medicine

## 2016-08-24 DIAGNOSIS — B009 Herpesviral infection, unspecified: Secondary | ICD-10-CM

## 2016-08-24 MED ORDER — VALACYCLOVIR HCL 500 MG PO TABS: 500.0000 mg | ORAL_TABLET | Freq: Every day | ORAL | 3 refills | Status: DC

## 2016-08-24 NOTE — Telephone Encounter (Signed)
Dr. Maudie Mercury - FYI - Lab result POS for [place] Patient Name: Erica Kramer Gender: Unknown DOB: February 09, 1955 Age: 61 Y 11 M 18 D Return Phone Number: City/State/Zip: [place] [employer] [facility] [place] Night - Client Client Site [facility] - Night Physician Colin Benton- MD Who Is Calling Lab Lab Name [facility] Lab Phone Number [phone] Option 2 Lab Tech Name Velna Hatchet Lab Reference Number [id number] Chief Complaint Lab Result (Critical or Stat) Call Type Lab Send to RN Reason for Call Report lab results Initial Comment Caller says she is calling in critical lab results. Please list name of person reporting value (Lab Employee) and a contact number. ---Terance Hart [phone] Please document the following items: Lab name Lab value (read back to lab to verify) Reference range for lab value Date and time blood was drawn Collect time of birth for bilirubin results ---[id number] pm Herpes simplex virus-- isolated Please collect the patient contact information from the lab. (name, phone number and address) ---Chauncey Cruel [phone] List any special notes provided by lab. ---Varicella zoster virus unable to report due to the presence of another virus

## 2016-08-24 NOTE — Progress Notes (Signed)
Discussed culture results with pt. Herpes simplex. 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 This last episodes had some pain around the L ear and malaise associated with it. X-husband had herpes. She responded well to valtrex and is feeling "great today" with resolution symptoms. She does not get genital or oral herpes. This causes her a lot of anxiety. She was told it was shingles in the past. She is interested in suppressive therapy. Discussed ID referral given her degree of anxiety and odd location and symptoms. She declined. Prefers to try suppressive therapy and see ID if recurs. Transmission, complications, treatment side effects, etc discussed. rx sent.

## 2016-08-26 LAB — VIRAL CULTURE VIRC

## 2016-08-26 LAB — CYTOMEGALOVIRUS CULTURE

## 2016-10-11 ENCOUNTER — Encounter: Payer: Self-pay | Admitting: Family Medicine

## 2016-10-12 ENCOUNTER — Ambulatory Visit (INDEPENDENT_AMBULATORY_CARE_PROVIDER_SITE_OTHER): Payer: BLUE CROSS/BLUE SHIELD | Admitting: Emergency Medicine

## 2016-10-12 DIAGNOSIS — Z23 Encounter for immunization: Secondary | ICD-10-CM | POA: Diagnosis not present

## 2017-01-31 ENCOUNTER — Encounter: Payer: Self-pay | Admitting: Family Medicine

## 2017-03-29 ENCOUNTER — Other Ambulatory Visit: Payer: Self-pay | Admitting: Family Medicine

## 2017-03-29 DIAGNOSIS — Z139 Encounter for screening, unspecified: Secondary | ICD-10-CM

## 2017-05-14 ENCOUNTER — Ambulatory Visit
Admission: RE | Admit: 2017-05-14 | Discharge: 2017-05-14 | Disposition: A | Discharge status: Home / Self Care | Payer: BLUE CROSS/BLUE SHIELD | Source: Ambulatory Visit | Attending: Family Medicine | Admitting: Family Medicine

## 2017-05-14 DIAGNOSIS — Z139 Encounter for screening, unspecified: Secondary | ICD-10-CM

## 2017-07-08 ENCOUNTER — Encounter: Payer: Self-pay | Admitting: Family Medicine

## 2017-07-08 ENCOUNTER — Ambulatory Visit: Payer: BLUE CROSS/BLUE SHIELD | Admitting: Family Medicine

## 2017-07-08 VITALS — BP 118/68 | HR 92 | Temp 98.2°F | Wt 131.7 lb

## 2017-07-08 DIAGNOSIS — H9202 Otalgia, left ear: Secondary | ICD-10-CM | POA: Diagnosis not present

## 2017-07-08 NOTE — Progress Notes (Signed)
Subjective:     Patient ID: Erica Kramer, female   DOB: September 11, 1955, 62 y.o.   MRN: [id number]  HPI Patient seen with left ear pain. She states last July she had some blisters in her ear and culture came back positive for herpes simplex. She denies ever having cold sores or any other herpes infections. Denies any ear drainage. No fevers or chills. Pain started a few days ago. She was concerned because she has upcoming travel with flying in a week or so. No dizziness,  No headache,  No hearing changes.  Has some mild discomfort couple of weeks ago and started her self back on Valtrex 500 mg daily but has taken somewhat inconsistently over the past week  Past Medical History:  Diagnosis Date  . Allergy    mild  . Anemia    as child only  . Anxiety    no medicines  . Chronic frontal sinusitis 01/28/2007   Qualifier: Diagnosis of  By: Arnoldo Morale MD, [place], ADENOMATOUS, HX OF 08/30/2008   Qualifier: Diagnosis of  By: Harlon Ditty CMA (AAMA), Dottie    . Constipation    diet controlled mainly  . Diverticulosis, acute   . DJD (degenerative joint disease)   . GERD 09/02/2008   Qualifier: Diagnosis of  By: Fuller Plan MD Marijo Conception Glaucoma   . Headache(784.0)   . HEMATOCHEZIA 09/02/2008   Qualifier: Diagnosis of  By: Fuller Plan MD Marijo Conception Macular degeneration syndrome   . MONILIASIS, SKIN/NAILS 01/28/2007   Qualifier: Diagnosis of  By: Arnoldo Morale MD, John E   . Neck pain, chronic   . Osteopenia   . PONV (postoperative nausea and vomiting)   . Rosacea   . TMJ (temporomandibular joint disorder)    Past Surgical History:  Procedure Laterality Date  . ABDOMINAL HYSTERECTOMY    . APPENDECTOMY    . COLONOSCOPY    . LAPAROSCOPIC APPENDECTOMY  03/12/2011   Procedure: APPENDECTOMY LAPAROSCOPIC;  Surgeon: Merrie Roof, MD;  Location: [facility];  Service: General;  Laterality: N/A;  . POLYPECTOMY    . TONSILLECTOMY      reports that she has never smoked. She has never  used smokeless tobacco. She reports that she drinks alcohol. She reports that she does not use drugs. family history includes Barrett's esophagus in her father; Breast cancer in her maternal aunt and mother; Cancer in her unknown relative; Colon cancer in her unknown relative; Colon polyps in her father; Diabetes in her unknown relative; Heart disease in her unknown relative. No Known Allergies   Review of Systems  Constitutional: Negative for chills and fever.  HENT: Positive for ear pain. Negative for ear discharge and facial swelling.   Skin: Negative for rash.  Neurological: Negative for facial asymmetry and headaches.       Objective:   Physical Exam  Constitutional: She appears well-developed and well-nourished.  HENT:  Right canal and eardrum appear normal. She has small amount of cerumen left canal which was removed with curette without difficulty. Ear canal appears entirely normal and eardrum is normal  Neck: Neck supple.  Cardiovascular: Normal rate and regular rhythm.  Pulmonary/Chest: Effort normal and breath sounds normal.  Lymphadenopathy:    She has no cervical adenopathy.       Assessment:     Left otalgia. Past history of herpes simplex involving ear canal but no evidence for any blisters currently. No evidence for otitis  media or otitis externa    Plan:     -Recommended observation for now. -She'll consider getting back on Valtrex 500 mg daily-prophylactically especially until she gets back from her trip  Eulas Post MD [facility]

## 2017-07-11 ENCOUNTER — Ambulatory Visit: Payer: BLUE CROSS/BLUE SHIELD | Admitting: Family Medicine

## 2017-11-07 ENCOUNTER — Ambulatory Visit (INDEPENDENT_AMBULATORY_CARE_PROVIDER_SITE_OTHER): Payer: BLUE CROSS/BLUE SHIELD

## 2017-11-07 DIAGNOSIS — Z23 Encounter for immunization: Secondary | ICD-10-CM

## 2017-11-14 ENCOUNTER — Ambulatory Visit (INDEPENDENT_AMBULATORY_CARE_PROVIDER_SITE_OTHER): Payer: BLUE CROSS/BLUE SHIELD | Admitting: Family Medicine

## 2017-11-14 ENCOUNTER — Encounter: Payer: Self-pay | Admitting: Family Medicine

## 2017-11-14 VITALS — BP 100/78 | HR 74 | Temp 98.2°F | Ht 64.0 in | Wt 129.1 lb

## 2017-11-14 DIAGNOSIS — Z1159 Encounter for screening for other viral diseases: Secondary | ICD-10-CM

## 2017-11-14 DIAGNOSIS — Z1331 Encounter for screening for depression: Secondary | ICD-10-CM

## 2017-11-14 DIAGNOSIS — Z23 Encounter for immunization: Secondary | ICD-10-CM

## 2017-11-14 DIAGNOSIS — Z0000 Encounter for general adult medical examination without abnormal findings: Secondary | ICD-10-CM | POA: Diagnosis not present

## 2017-11-14 LAB — HEMOGLOBIN A1C: Hgb A1c MFr Bld: 5.7 % (ref 4.6–6.5)

## 2017-11-14 LAB — HDL CHOLESTEROL: HDL: 67.2 mg/dL (ref >39.00)

## 2017-11-14 LAB — CHOLESTEROL, TOTAL: Cholesterol: 214 mg/dL — ABNORMAL HIGH (ref 0–200)

## 2017-11-14 NOTE — Addendum Note (Signed)
Addended by: Agnes Lawrence on: 11/14/2017 02:34 PM   Modules accepted: Orders

## 2017-11-14 NOTE — Addendum Note (Signed)
Addended by: Lucretia Kern on: 11/14/2017 02:28 PM   Modules accepted: Orders

## 2017-11-14 NOTE — Patient Instructions (Signed)
BEFORE YOU LEAVE: -shinrix -labs -follow up: yearly for CPE  We have ordered labs or studies at this visit. It can take up to 1-2 weeks for results and processing. IF results require follow up or explanation, we will call you with instructions. Clinically stable results will be released to your [facility]. If you have not heard from [place] or cannot find your results in [facility] in 2 weeks please contact our office at [phone].  If you are not yet signed up for [facility], please consider signing up.   Preventive Care 40-64 Years, Female Preventive care refers to lifestyle choices and visits with your health care provider that can promote health and wellness. What does preventive care include?  A yearly physical exam. This is also called an annual well check.  Dental exams once or twice a year.  Routine eye exams. Ask your health care provider how often you should have your eyes checked.  Personal lifestyle choices, including: ? Daily care of your teeth and gums. ? Regular physical activity. ? Eating a healthy diet. ? Avoiding tobacco and drug use. ? Limiting alcohol use. ? Practicing safe sex. ? Taking vitamin and mineral supplements as recommended by your health care provider. What happens during an annual well check? The services and screenings done by your health care provider during your annual well check will depend on your age, overall health, lifestyle risk factors, and family history of disease. Counseling Your health care provider may ask you questions about your:  Alcohol use.  Tobacco use.  Drug use.  Emotional well-being.  Home and relationship well-being.  Sexual activity.  Eating habits.  Work and work Statistician.  Method of birth control.  Menstrual cycle.  Pregnancy history.  Screening You may have the following tests or measurements:  Height, weight, and BMI.  Blood pressure.  Lipid and cholesterol levels. These may be checked every 5 years, or  more frequently if you are over 71 years old.  Skin check.  Lung cancer screening. You may have this screening every year starting at age 11 if you have a 30-pack-year history of smoking and currently smoke or have quit within the past 15 years.  Fecal occult blood test (FOBT) of the stool. You may have this test every year starting at age 74.  Flexible sigmoidoscopy or colonoscopy. You may have a sigmoidoscopy every 5 years or a colonoscopy every 10 years starting at age 39.  Hepatitis C blood test.  Hepatitis B blood test.  Sexually transmitted disease (STD) testing.  Diabetes screening. This is done by checking your blood sugar (glucose) after you have not eaten for a while (fasting). You may have this done every 1-3 years.  Mammogram. This may be done every 1-2 years. Talk to your health care provider about when you should start having regular mammograms. This may depend on whether you have a family history of breast cancer.  BRCA-related cancer screening. This may be done if you have a family history of breast, ovarian, tubal, or peritoneal cancers.  Pelvic exam and Pap test. This may be done every 3 years starting at age 86. Starting at age 67, this may be done every 5 years if you have a Pap test in combination with an HPV test.  Bone density scan. This is done to screen for osteoporosis. You may have this scan if you are at high risk for osteoporosis.  Discuss your test results, treatment options, and if necessary, the need for more tests with your health  care provider. Vaccines Your health care provider may recommend certain vaccines, such as:  Influenza vaccine. This is recommended every year.  Tetanus, diphtheria, and acellular pertussis (Tdap, Td) vaccine. You may need a Td booster every 10 years.  Varicella vaccine. You may need this if you have not been vaccinated.  Zoster vaccine. You may need this after age 32.  Measles, mumps, and rubella (MMR) vaccine. You may  need at least one dose of MMR if you were born in 1957 or later. You may also need a second dose.  Pneumococcal 13-valent conjugate (PCV13) vaccine. You may need this if you have certain conditions and were not previously vaccinated.  Pneumococcal polysaccharide (PPSV23) vaccine. You may need one or two doses if you smoke cigarettes or if you have certain conditions.  Meningococcal vaccine. You may need this if you have certain conditions.  Hepatitis A vaccine. You may need this if you have certain conditions or if you travel or work in places where you may be exposed to hepatitis A.  Hepatitis B vaccine. You may need this if you have certain conditions or if you travel or work in places where you may be exposed to hepatitis B.  Haemophilus influenzae type b (Hib) vaccine. You may need this if you have certain conditions.  Talk to your health care provider about which screenings and vaccines you need and how often you need them. This information is not intended to replace advice given to you by your health care provider. Make sure you discuss any questions you have with your health care provider. Document Released: 02/04/2015 Document Revised: 09/28/2015 Document Reviewed: 11/09/2014 Elsevier Interactive Patient Education  [employer].

## 2017-11-14 NOTE — Progress Notes (Signed)
HPI:  Using dictation device. Unfortunately this device frequently misinterprets words/phrases.  Here for CPE: Does pap and mammo and bone density with gyn - scheduled. -Concerns and/or follow up today:  -bilateral intermittent itching ears - thought was herpes so has been using antiviral but does not help.  -Diet: variety of foods, balance and well rounded, larger portion sizes -Exercise: no regular exercise -Taking folic acid, vitamin D or calcium: no -Diabetes and Dyslipidemia Screening:not fasting -Vaccines: see vaccine section Epic; interested in shinrix -pap history: see gyn -FDLMP: see nursing notes -sexual activity:  -wants STI testing (Hep C if born 1947-65):agrees to hep c screen -FH breast, colon or ovarian ca: see FH Last mammogram: 4/19 done Last colon cancer screening: 4/18 colonoscopy Breast Ca Risk Assessment: see family history and pt history DEXA (>/= 13): done with gyn 3/17 with osteopenia  -Alcohol, Tobacco, drug use: see social history  Review of Systems - no reported fevers, unintentional weight loss, vision loss, hearing loss, chest pain, sob, hemoptysis, melena, hematochezia, hematuria, genital discharge, changing or concerning skin lesions, bleeding, bruising, loc, thoughts of self harm or SI  Past Medical History:  Diagnosis Date  . Allergy    mild  . Anemia    as child only  . Anxiety    no medicines  . Chronic frontal sinusitis 01/28/2007   Qualifier: Diagnosis of  By: Arnoldo Morale MD, [place], ADENOMATOUS, HX OF 08/30/2008   Qualifier: Diagnosis of  By: Harlon Ditty CMA (AAMA), Dottie    . Constipation    diet controlled mainly  . Diverticulosis, acute   . DJD (degenerative joint disease)   . GERD 09/02/2008   Qualifier: Diagnosis of  By: Fuller Plan MD [place] Conception Glaucoma   . Headache(784.0)   . HEMATOCHEZIA 09/02/2008   Qualifier: Diagnosis of  By: Fuller Plan MD Marijo Conception Macular degeneration syndrome   . MONILIASIS,  SKIN/NAILS 01/28/2007   Qualifier: Diagnosis of  By: Arnoldo Morale MD, John E   . Neck pain, chronic   . Osteopenia   . PONV (postoperative nausea and vomiting)   . Rosacea   . TMJ (temporomandibular joint disorder)     Past Surgical History:  Procedure Laterality Date  . ABDOMINAL HYSTERECTOMY    . APPENDECTOMY    . COLONOSCOPY    . LAPAROSCOPIC APPENDECTOMY  03/12/2011   Procedure: APPENDECTOMY LAPAROSCOPIC;  Surgeon: Merrie Roof, MD;  Location: [facility];  Service: General;  Laterality: N/A;  . POLYPECTOMY    . TONSILLECTOMY      Family History  Problem Relation Age of Onset  . Breast cancer Mother   . Colon polyps Father   . Barrett's esophagus Father   . Heart disease Unknown   . Cancer Unknown   . Colon cancer Unknown   . Diabetes Unknown   . Breast cancer Maternal Aunt   . Rectal cancer Neg Hx   . Stomach cancer Neg Hx   . Esophageal cancer Neg Hx     Social History   Socioeconomic History  . Marital status: Married    Spouse name: Not on file  . Number of children: Not on file  . Years of education: Not on file  . Highest education level: Not on file  Occupational History  . Not on file  Social Needs  . Financial resource strain: Not on file  . Food insecurity:    Worry: Not on file  Inability: Not on file  . Transportation needs:    Medical: Not on file    Non-medical: Not on file  Tobacco Use  . Smoking status: Never Smoker  . Smokeless tobacco: Never Used  Substance and Sexual Activity  . Alcohol use: Yes    Comment: occasional wine  . Drug use: No  . Sexual activity: Yes  Lifestyle  . Physical activity:    Days per week: Not on file    Minutes per session: Not on file  . Stress: Not on file  Relationships  . Social connections:    Talks on phone: Not on file    Gets together: Not on file    Attends religious service: Not on file    Active member of club or organization: Not on file    Attends meetings of clubs or organizations: Not on file     Relationship status: Not on file  Other Topics Concern  . 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  calcium-vitamin D (OSCAL WITH D) 500-200 MG-UNIT per tablet, Take 1 tablet by mouth daily. Takes on and off, Disp: , Rfl:  .  ciclopirox (PENLAC) 8 % solution, USE ON NAIL & SURROUNDING SKIN AT BED. USE DAILY OVER PREVIOUS COAT X7DAYS, REMOVE W/ALCOHOL RESTART, Disp: 6.6 mL, Rfl: 0 .  clindamycin (CLEOCIN) 2 % vaginal cream, INSERT 1 APPLICATORFUL AT BEDTIME, Disp: , Rfl: 0 .  ergocalciferol (VITAMIN D2) 50000 UNITS capsule, Take 50,000 Units by mouth once a week., Disp: , Rfl:  .  estradiol (ESTRACE) 0.1 MG/GM vaginal cream, Place 1 Applicatorful vaginally as needed., Disp: , Rfl:  .  estradiol (VIVELLE-DOT) 0.0375 MG/24HR, Place 1 patch onto the skin once a week. Apply once a week, Disp: , Rfl:  .  fluticasone (FLONASE) 50 MCG/ACT nasal spray, Place into both nostrils daily., Disp: , Rfl:  .  latanoprost (XALATAN) 0.005 % ophthalmic solution, Place 1 drop into both eyes at bedtime.  , Disp: , Rfl:  .  loratadine (CLARITIN) 10 MG tablet, Take 10 mg by mouth daily as needed. , Disp: , Rfl:  .  mometasone (ELOCON) 0.1 % lotion, Apply topically daily., Disp: 60 mL, Rfl: 0 .  nystatin-triamcinolone ointment (MYCOLOG), APPLY TO AFFECTED AREA TWICE A DAY, Disp: , Rfl: 0 .  valACYclovir (VALTREX) 500 MG tablet, Take 1 tablet (500 mg total) by mouth daily., Disp: 90 tablet, Rfl: 3  Current Facility-Administered Medications:  .  0.9 %  sodium chloride infusion, 500 mL, Intravenous, Continuous, Ladene Artist, MD  EXAM:  Vitals:   11/14/17 1305  BP: 100/78  Pulse: 74  Temp: 98.2 F  (36.8 C)    GENERAL: vitals reviewed and listed below, alert, oriented, appears well hydrated and in no acute distress  HEENT: head atraumatic, PERRLA, normal appearance of eyes, ears, nose and mouth. moist mucus membranes. Very small amount of wax both ear canals. O/w normal exam both ears.  NECK: supple, no masses or lymphadenopathy  LUNGS: clear to auscultation bilaterally, no rales, rhonchi or wheeze  CV: HRRR, no peripheral edema or cyanosis, normal  pedal pulses  ABDOMEN: bowel sounds normal, soft, non tender to palpation, no masses, no rebound or guarding  GU/BREAST: does with gyn  SKIN: no rash or abnormal lesions  MS: normal gait, moves all extremities normally  NEURO: normal gait, speech and thought processing grossly intact, muscle tone grossly intact throughout  PSYCH: normal affect, pleasant and cooperative  ASSESSMENT AND PLAN:  Discussed the following assessment and plan:  PREVENTIVE EXAM: -Discussed and advised all [place] preventive services health task force level A and B recommendations for age, sex and risks. -Advised at least 150 minutes of exercise per week and a healthy diet with avoidance of (less then 1 serving per week) processed foods, white starches, red meat, fast foods and sweets and consisting of: * 5-9 servings of fresh fruits and vegetables (not corn or potatoes) *nuts and seeds, beans *olives and olive oil *lean meats such as fish and white chicken  *whole grains -labs, studies and vaccines per orders this encounter  Ear wax: trial debrox drops, ent eval if symptoms persist, as she has been worried about this. I do not think this is viral related.  Patient advised to return to clinic immediately if symptoms worsen or persist or new concerns.  Patient Instructions  BEFORE YOU LEAVE: -shinrix -labs -follow up: yearly for CPE  We have ordered labs or studies at this visit. It can take up to 1-2 weeks for results and processing. IF results  require follow up or explanation, we will call you with instructions. Clinically stable results will be released to your [facility]. If you have not heard from [place] or cannot find your results in [facility] in 2 weeks please contact our office at [phone].  If you are not yet signed up for [facility], please consider signing up.   Preventive Care 40-64 Years, Female Preventive care refers to lifestyle choices and visits with your health care provider that can promote health and wellness. What does preventive care include?  A yearly physical exam. This is also called an annual well check.  Dental exams once or twice a year.  Routine eye exams. Ask your health care provider how often you should have your eyes checked.  Personal lifestyle choices, including: ? Daily care of your teeth and gums. ? Regular physical activity. ? Eating a healthy diet. ? Avoiding tobacco and drug use. ? Limiting alcohol use. ? Practicing safe sex. ? Taking vitamin and mineral supplements as recommended by your health care provider. What happens during an annual well check? The services and screenings done by your health care provider during your annual well check will depend on your age, overall health, lifestyle risk factors, and family history of disease. Counseling Your health care provider may ask you questions about your:  Alcohol use.  Tobacco use.  Drug use.  Emotional well-being.  Home and relationship well-being.  Sexual activity.  Eating habits.  Work and work Statistician.  Method of birth control.  Menstrual cycle.  Pregnancy history.  Screening You may have the following tests or measurements:  Height, weight, and BMI.  Blood pressure.  Lipid and cholesterol levels. These may be checked every 5 years, or more frequently if you are over 65 years old.  Skin check.  Lung cancer screening. You may have this screening every year starting at age 51 if you have a 30-pack-year history  of smoking and currently smoke or have quit within the past 15 years.  Fecal occult blood test (FOBT) of the stool. You may have  this test every year starting at age 86.  Flexible sigmoidoscopy or colonoscopy. You may have a sigmoidoscopy every 5 years or a colonoscopy every 10 years starting at age 98.  Hepatitis C blood test.  Hepatitis B blood test.  Sexually transmitted disease (STD) testing.  Diabetes screening. This is done by checking your blood sugar (glucose) after you have not eaten for a while (fasting). You may have this done every 1-3 years.  Mammogram. This may be done every 1-2 years. Talk to your health care provider about when you should start having regular mammograms. This may depend on whether you have a family history of breast cancer.  BRCA-related cancer screening. This may be done if you have a family history of breast, ovarian, tubal, or peritoneal cancers.  Pelvic exam and Pap test. This may be done every 3 years starting at age 45. Starting at age 31, this may be done every 5 years if you have a Pap test in combination with an HPV test.  Bone density scan. This is done to screen for osteoporosis. You may have this scan if you are at high risk for osteoporosis.  Discuss your test results, treatment options, and if necessary, the need for more tests with your health care provider. Vaccines Your health care provider may recommend certain vaccines, such as:  Influenza vaccine. This is recommended every year.  Tetanus, diphtheria, and acellular pertussis (Tdap, Td) vaccine. You may need a Td booster every 10 years.  Varicella vaccine. You may need this if you have not been vaccinated.  Zoster vaccine. You may need this after age 17.  Measles, mumps, and rubella (MMR) vaccine. You may need at least one dose of MMR if you were born in 1957 or later. You may also need a second dose.  Pneumococcal 13-valent conjugate (PCV13) vaccine. You may need this if you have  certain conditions and were not previously vaccinated.  Pneumococcal polysaccharide (PPSV23) vaccine. You may need one or two doses if you smoke cigarettes or if you have certain conditions.  Meningococcal vaccine. You may need this if you have certain conditions.  Hepatitis A vaccine. You may need this if you have certain conditions or if you travel or work in places where you may be exposed to hepatitis A.  Hepatitis B vaccine. You may need this if you have certain conditions or if you travel or work in places where you may be exposed to hepatitis B.  Haemophilus influenzae type b (Hib) vaccine. You may need this if you have certain conditions.  Talk to your health care provider about which screenings and vaccines you need and how often you need them. This information is not intended to replace advice given to you by your health care provider. Make sure you discuss any questions you have with your health care provider. Document Released: 02/04/2015 Document Revised: 09/28/2015 Document Reviewed: 11/09/2014 Elsevier Interactive Patient Education  2018 [employer].           No follow-ups on file.  Lucretia Kern, DO

## 2017-11-14 NOTE — Addendum Note (Signed)
Addended by: Lucretia Kern on: 11/14/2017 02:19 PM   Modules accepted: Orders

## 2017-11-15 LAB — HEPATITIS C ANTIBODY
Hepatitis C Ab: NONREACTIVE
SIGNAL TO CUT-OFF: 0.05 (ref <1.00)

## 2018-02-21 ENCOUNTER — Encounter: Payer: Self-pay | Admitting: Family Medicine

## 2018-02-21 ENCOUNTER — Other Ambulatory Visit: Payer: Self-pay

## 2018-02-21 ENCOUNTER — Ambulatory Visit (INDEPENDENT_AMBULATORY_CARE_PROVIDER_SITE_OTHER): Payer: BLUE CROSS/BLUE SHIELD | Admitting: Family Medicine

## 2018-02-21 VITALS — BP 102/64 | HR 78 | Temp 97.9°F | Ht 64.0 in | Wt 134.6 lb

## 2018-02-21 DIAGNOSIS — H9202 Otalgia, left ear: Secondary | ICD-10-CM | POA: Diagnosis not present

## 2018-02-21 DIAGNOSIS — R21 Rash and other nonspecific skin eruption: Secondary | ICD-10-CM | POA: Diagnosis not present

## 2018-02-21 MED ORDER — MUPIROCIN 2 % EX OINT: 1.0000 "application " | TOPICAL_OINTMENT | Freq: Two times a day (BID) | CUTANEOUS | 0 refills | Status: DC

## 2018-02-21 NOTE — Patient Instructions (Signed)
Apply the Bactroban ointment to ear twice daily  Let Dr Maudie Mercury know if not improved in 1-2 weeks.

## 2018-02-21 NOTE — Progress Notes (Signed)
Subjective:     Patient ID: Erica Kramer, female   DOB: [birth year], 63 y.o.   MRN: [id number]  HPI Patient seen with left earache past 5 or 6 days.  States her ear started feeling slightly better yesterday.  She has had similar complaints in the past.  Denies any hearing loss.  No ear drainage.  Does have some nasal congestion.  She notices sometimes when she yawns she has some popping sensation in the ear.  No recent flying.  She does not use Q-tips and no foreign bodies in the ear recently  Past Medical History:  Diagnosis Date  . Allergy    mild  . Anemia    as child only  . Anxiety    no medicines  . Chronic frontal sinusitis 01/28/2007   Qualifier: Diagnosis of  By: Arnoldo Morale MD, [place], ADENOMATOUS, HX OF 08/30/2008   Qualifier: Diagnosis of  By: Harlon Ditty CMA (AAMA), Dottie    . Constipation    diet controlled mainly  . Diverticulosis, acute   . DJD (degenerative joint disease)   . GERD 09/02/2008   Qualifier: Diagnosis of  By: Fuller Plan MD Marijo Conception Glaucoma   . Headache(784.0)   . HEMATOCHEZIA 09/02/2008   Qualifier: Diagnosis of  By: Fuller Plan MD Marijo Conception Macular degeneration syndrome   . MONILIASIS, SKIN/NAILS 01/28/2007   Qualifier: Diagnosis of  By: Arnoldo Morale MD, John E   . Neck pain, chronic   . Osteopenia   . PONV (postoperative nausea and vomiting)   . Rosacea   . TMJ (temporomandibular joint disorder)    Past Surgical History:  Procedure Laterality Date  . ABDOMINAL HYSTERECTOMY    . APPENDECTOMY    . COLONOSCOPY    . LAPAROSCOPIC APPENDECTOMY  03/12/2011   Procedure: APPENDECTOMY LAPAROSCOPIC;  Surgeon: Merrie Roof, MD;  Location: [facility];  Service: General;  Laterality: N/A;  . POLYPECTOMY    . TONSILLECTOMY      reports that she has never smoked. She has never used smokeless tobacco. She reports current alcohol use. She reports that she does not use drugs. family history includes Barrett's esophagus in her father; Breast  cancer in her maternal aunt and mother; Cancer in an other family member; Colon cancer in an other family member; Colon polyps in her father; Diabetes in an other family member; Heart disease in an other family member. No Known Allergies   Review of Systems  Constitutional: Negative for chills and fever.  HENT: Positive for ear pain. Negative for ear discharge, facial swelling and hearing loss.   Hematological: Negative for adenopathy.       Objective:   Physical Exam Constitutional:      Appearance: Normal appearance.  HENT:     Right Ear: Tympanic membrane, ear canal and external ear normal.     Ears:     Comments: Left canal examined.  She has minimal amount of cerumen in the left canal.  Just inside the left canal she has small approximately 1/2 to 1/2 cm area of mild erythema with some superficial yellow crusting eardrum is nonerythematous.  No bulging. Neurological:     Mental Status: She is alert.        Assessment:     Left otalgia.  She has what appears to be almost a small abraded area  just inside the left ear canal.  Does have a little bit yellow crusted drainage.  No evidence of otitis externa.  No evidence for otitis media.    Plan:     -Apply Bactroban ointment twice daily and follow-up with primary if not healing in the next couple of weeks  Eulas Post MD [facility]

## 2018-02-27 ENCOUNTER — Ambulatory Visit (INDEPENDENT_AMBULATORY_CARE_PROVIDER_SITE_OTHER): Payer: BLUE CROSS/BLUE SHIELD | Admitting: Family Medicine

## 2018-02-27 ENCOUNTER — Encounter: Payer: Self-pay | Admitting: Family Medicine

## 2018-02-27 VITALS — BP 120/80 | HR 97 | Temp 98.1°F | Ht 64.0 in | Wt 133.7 lb

## 2018-02-27 DIAGNOSIS — H9202 Otalgia, left ear: Secondary | ICD-10-CM

## 2018-02-27 DIAGNOSIS — B009 Herpesviral infection, unspecified: Secondary | ICD-10-CM | POA: Diagnosis not present

## 2018-02-27 DIAGNOSIS — H6122 Impacted cerumen, left ear: Secondary | ICD-10-CM

## 2018-02-27 MED ORDER — VALACYCLOVIR HCL 500 MG PO TABS: 500.0000 mg | ORAL_TABLET | Freq: Every day | ORAL | 3 refills | Status: DC

## 2018-02-27 NOTE — Patient Instructions (Signed)
Take the valtrex daily.  Let us know if persistent or recurrent issues and would recommend seeing the dermatologist.

## 2018-02-27 NOTE — Progress Notes (Signed)
HPI:  Using dictation device. Unfortunately this device frequently misinterprets words/phrases.  Acute visit for L ear pain: -saw my colleague recently for this and now improving - reports tried flonase as question ETD (her eye doctor said was ok to use despite glaucoma), but had blister as well, now healing -this is a recurrent issue and she gets a blister on this ear with occurrences -this has gone on for years -Positive herpes simplex culture of lesion 07/2016, we tried an episodic antiviral for this and it "worked like magic" as would resolve symptoms promptly -she has had 3 occurrences this years, wonders if way to prevent -I thought had rxd preventive antiviral in the  past, but does not seem she took it this way, xhusband had herpes   ROS: See pertinent positives and negatives per HPI.  Past Medical History:  Diagnosis Date  . Allergy    mild  . Anemia    as child only  . Anxiety    no medicines  . Chronic frontal sinusitis 01/28/2007   Qualifier: Diagnosis of  By: Arnoldo Morale MD, [place], ADENOMATOUS, HX OF 08/30/2008   Qualifier: Diagnosis of  By: Harlon Ditty CMA (AAMA), Dottie    . Constipation    diet controlled mainly  . Diverticulosis, acute   . DJD (degenerative joint disease)   . GERD 09/02/2008   Qualifier: Diagnosis of  By: Fuller Plan MD Marijo Conception Glaucoma   . Headache(784.0)   . HEMATOCHEZIA 09/02/2008   Qualifier: Diagnosis of  By: Fuller Plan MD Marijo Conception Macular degeneration syndrome   . MONILIASIS, SKIN/NAILS 01/28/2007   Qualifier: Diagnosis of  By: Arnoldo Morale MD, John E   . Neck pain, chronic   . Osteopenia   . PONV (postoperative nausea and vomiting)   . Rosacea   . TMJ (temporomandibular joint disorder)     Past Surgical History:  Procedure Laterality Date  . ABDOMINAL HYSTERECTOMY    . APPENDECTOMY    . COLONOSCOPY    . LAPAROSCOPIC APPENDECTOMY  03/12/2011   Procedure: APPENDECTOMY LAPAROSCOPIC;  Surgeon: Merrie Roof,  MD;  Location: [facility];  Service: General;  Laterality: N/A;  . POLYPECTOMY    . TONSILLECTOMY      Family History  Problem Relation Age of Onset  . Breast cancer Mother   . Colon polyps Father   . Barrett's esophagus Father   . Heart disease Other   . Cancer Other   . Colon cancer Other   . Diabetes Other   . Breast cancer Maternal Aunt   . Rectal cancer Neg Hx   . Stomach cancer Neg Hx   . Esophageal cancer Neg Hx     SOCIAL HX: see hpi   Current Outpatient Medications:  .  calcium-vitamin D (OSCAL WITH D) 500-200 MG-UNIT per tablet, Take 1 tablet by mouth daily. Takes on and off, Disp: , Rfl:  .  ciclopirox (PENLAC) 8 % solution, USE ON NAIL & SURROUNDING SKIN AT BED. USE DAILY OVER PREVIOUS COAT X7DAYS, REMOVE W/ALCOHOL RESTART, Disp: 6.6 mL, Rfl: 0 .  clindamycin (CLEOCIN) 2 % vaginal cream, INSERT 1 APPLICATORFUL AT BEDTIME, Disp: , Rfl: 0 .  ergocalciferol (VITAMIN D2) 50000 UNITS capsule, Take 50,000 Units by mouth once a week., Disp: , Rfl:  .  estradiol (ESTRACE) 0.1 MG/GM vaginal cream, Place 1 Applicatorful vaginally as needed., Disp: , Rfl:  .  estradiol (VIVELLE-DOT) 0.0375 MG/24HR, Place 1 patch  onto the skin once a week. Apply once a week, Disp: , Rfl:  .  fluticasone (FLONASE) 50 MCG/ACT nasal spray, Place into both nostrils daily., Disp: , Rfl:  .  latanoprost (XALATAN) 0.005 % ophthalmic solution, Place 1 drop into both eyes at bedtime.  , Disp: , Rfl:  .  loratadine (CLARITIN) 10 MG tablet, Take 10 mg by mouth daily as needed. , Disp: , Rfl:  .  mometasone (ELOCON) 0.1 % lotion, Apply topically daily., Disp: 60 mL, Rfl: 0 .  mupirocin ointment (BACTROBAN) 2 %, Place 1 application into the nose 2 (two) times daily., Disp: 22 g, Rfl: 0 .  nystatin-triamcinolone ointment (MYCOLOG), APPLY TO AFFECTED AREA TWICE A DAY, Disp: , Rfl: 0 .  valACYclovir (VALTREX) 500 MG tablet, Take 1 tablet (500 mg total) by mouth daily., Disp: 90 tablet, Rfl: 3  Current  Facility-Administered Medications:  .  0.9 %  sodium chloride infusion, 500 mL, Intravenous, Continuous, Ladene Artist, MD  EXAM:  Vitals:   02/27/18 1500  BP: 120/80  Pulse: 97  Temp: 98.1 F (36.7 C)    Body mass index is 22.95 kg/m.  GENERAL: vitals reviewed and listed above, alert, oriented, appears well hydrated and in no acute distress  HEENT: atraumatic, conjunttiva clear, no obvious abnormalities on inspection of external nose and ears, small dried healing lesion just outside L ear canal, no lesions in canal, normal canal and TM after removal small amount of soft wax  NECK: no obvious masses on inspection  MS: moves all extremities without noticeable abnormality  PSYCH: pleasant and cooperative, no obvious depression or anxiety  ASSESSMENT AND PLAN:  Discussed the following assessment and plan:  Herpes  Impacted cerumen of left ear  Left ear pain  -with her permission soft wax was removed gently from L ear canal with curette - normal canal and TM -healing lesion L ear - 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 -Patient advised to return or notify a doctor immediately if symptoms worsen or persist or new concerns arise.  Patient Instructions  Take the valtrex daily.  Let us know if persistent or recurrent issues and would recommend seeing the dermatologist.     Lucretia Kern, DO

## 2018-03-16 ENCOUNTER — Encounter: Payer: Self-pay | Admitting: Family Medicine

## 2018-05-19 ENCOUNTER — Other Ambulatory Visit: Payer: Self-pay | Admitting: Obstetrics and Gynecology

## 2018-05-19 DIAGNOSIS — Z1231 Encounter for screening mammogram for malignant neoplasm of breast: Secondary | ICD-10-CM

## 2018-05-24 ENCOUNTER — Other Ambulatory Visit: Payer: Self-pay

## 2018-05-24 ENCOUNTER — Encounter (HOSPITAL_COMMUNITY): Payer: Self-pay | Admitting: Emergency Medicine

## 2018-05-24 ENCOUNTER — Telehealth (HOSPITAL_COMMUNITY): Payer: Self-pay | Admitting: Emergency Medicine

## 2018-05-24 ENCOUNTER — Ambulatory Visit (HOSPITAL_COMMUNITY)
Admission: EM | Admit: 2018-05-24 | Discharge: 2018-05-24 | Disposition: A | Discharge status: Home / Self Care | Payer: BLUE CROSS/BLUE SHIELD | Attending: Emergency Medicine | Admitting: Emergency Medicine

## 2018-05-24 DIAGNOSIS — M545 Low back pain, unspecified: Secondary | ICD-10-CM

## 2018-05-24 DIAGNOSIS — M62830 Muscle spasm of back: Secondary | ICD-10-CM

## 2018-05-24 LAB — POCT URINALYSIS DIP (DEVICE)
Bilirubin Urine: NEGATIVE
Glucose, UA: NEGATIVE mg/dL
Hgb urine dipstick: NEGATIVE
Ketones, ur: NEGATIVE mg/dL
Leukocytes,Ua: NEGATIVE
Nitrite: NEGATIVE
Protein, ur: NEGATIVE mg/dL
Specific Gravity, Urine: 1.015 (ref 1.005–1.030)
Urobilinogen, UA: 0.2 mg/dL (ref 0.0–1.0)
pH: 6 (ref 5.0–8.0)

## 2018-05-24 MED ORDER — TIZANIDINE HCL 2 MG PO TABS: 2.0000 mg | ORAL_TABLET | Freq: Every day | ORAL | 0 refills | Status: DC

## 2018-05-24 NOTE — Telephone Encounter (Signed)
Patient requesting permission to take 1/2 dose of muscle relaxer now and remainder at bedtime.  Augusto Gamble, NP agreed..  Patient aware of instructions and understands precautions

## 2018-05-24 NOTE — ED Triage Notes (Signed)
Per pt she said she normally does not sleep on her back and woke up this morning on her back and now is having spasms and can't bend or pick up anything. Says it feels better to walk or stand. No injury

## 2018-05-24 NOTE — Discharge Instructions (Signed)
Your urine is normal here today which is reassuring.  Kidney stone vs muscular pain still to be considered.  I have sent a muscle relaxer to the pharmacy you may use as needed, primarily at night.  I would recommend sleeping with pillow under the knees.  Tylenol as needed for pain as NSAIDs may cause stomach upset. If use ibuprofen or aleve take with your pepcid and with food.  If any worsening of symptoms please go to the ER.  If no improvement or persistent symptoms please follow up with your primary care provider.

## 2018-05-24 NOTE — ED Provider Notes (Signed)
[place]    CSN: [id number] Arrival date & time: 05/24/18  1408     History   Chief Complaint Chief Complaint  Patient presents with  . Back Pain    HPI Erica Kramer is a 63 y.o. female.   Erica Kramer presents with complaints of right sided low back. States she woke this morning sleeping on her stomach which has caused her some back pain in the past. No other specific injury to the back. Pain has currently improved. States she had a few episodes of "spasms" earlier today in which pain was very severe and "stopped her in her tracks" even while she was just standing in front of her mirror brushing her hair. No numbness or tingling. She developed abdominal discomfort shortly after the onset of her symptoms as well as some mild radiation of pain to her abdomen. States she has significant history of abdominal complications No fevers. History of gout. No history of kidney stones. No urinary symptoms. No blood in urine. Pain is 6/10 currently. She did take tylenol earlier today. Denies any previous similar.    ROS per HPI, negative if not otherwise mentioned.      Past Medical History:  Diagnosis Date  . Allergy    mild  . Anemia    as child only  . Anxiety    no medicines  . Chronic frontal sinusitis 01/28/2007   Qualifier: Diagnosis of  By: Arnoldo Morale MD, [place], ADENOMATOUS, HX OF 08/30/2008   Qualifier: Diagnosis of  By: Harlon Ditty CMA (AAMA), Dottie    . Constipation    diet controlled mainly  . Diverticulosis, acute   . DJD (degenerative joint disease)   . GERD 09/02/2008   Qualifier: Diagnosis of  By: Fuller Plan MD Marijo Conception Glaucoma   . Headache(784.0)   . HEMATOCHEZIA 09/02/2008   Qualifier: Diagnosis of  By: Fuller Plan MD Marijo Conception Macular degeneration syndrome   . MONILIASIS, SKIN/NAILS 01/28/2007   Qualifier: Diagnosis of  By: Arnoldo Morale MD, John E   . Neck pain, chronic   . Osteopenia   . PONV (postoperative nausea and  vomiting)   . Rosacea   . TMJ (temporomandibular joint disorder)     Patient Active Problem List   Diagnosis Date Noted  . Frequent headaches 01/05/2014  . Neck pain 01/05/2014  . GERD 09/02/2008    Past Surgical History:  Procedure Laterality Date  . ABDOMINAL HYSTERECTOMY    . APPENDECTOMY    . COLONOSCOPY    . LAPAROSCOPIC APPENDECTOMY  03/12/2011   Procedure: APPENDECTOMY LAPAROSCOPIC;  Surgeon: Merrie Roof, MD;  Location: [facility];  Service: General;  Laterality: N/A;  . POLYPECTOMY    . TONSILLECTOMY      OB History   No obstetric history on file.      Home Medications    Prior to Admission medications   Medication Sig Start Date End Date Taking? Authorizing Provider  calcium-vitamin D (OSCAL WITH D) 500-200 MG-UNIT per tablet Take 1 tablet by mouth daily. Takes on and off    [provider]  ciclopirox (PENLAC) 8 % solution USE ON NAIL & SURROUNDING SKIN AT BED. USE DAILY OVER PREVIOUS COAT X7DAYS, REMOVE W/ALCOHOL RESTART 10/10/15   Lucretia Kern, DO  clindamycin (CLEOCIN) 2 % vaginal cream INSERT 1 APPLICATORFUL AT BEDTIME 08/10/16   [provider]  ergocalciferol (VITAMIN D2) 50000 UNITS capsule Take  50,000 Units by mouth once a week.    [provider]  estradiol (ESTRACE) 0.1 MG/GM vaginal cream Place 1 Applicatorful vaginally as needed.    [provider]  estradiol (VIVELLE-DOT) 0.0375 MG/24HR Place 1 patch onto the skin once a week. Apply once a week    [provider]  fluticasone (FLONASE) 50 MCG/ACT nasal spray Place into both nostrils daily.    [provider]  latanoprost (XALATAN) 0.005 % ophthalmic solution Place 1 drop into both eyes at bedtime.      [provider]  loratadine (CLARITIN) 10 MG tablet Take 10 mg by mouth daily as needed.     [provider]  mometasone (ELOCON) 0.1 % lotion Apply topically daily. 06/19/16   Burchette, Alinda Sierras, MD  mupirocin ointment (BACTROBAN) 2 %  Place 1 application into the nose 2 (two) times daily. 02/21/18   Burchette, Alinda Sierras, MD  nystatin-triamcinolone ointment (MYCOLOG) APPLY TO AFFECTED AREA TWICE A DAY 08/10/16   [provider]  tiZANidine (ZANAFLEX) 2 MG tablet Take 1 tablet (2 mg total) by mouth at bedtime. 05/24/18   Zigmund Gottron, NP  valACYclovir (VALTREX) 500 MG tablet Take 1 tablet (500 mg total) by mouth daily. 02/27/18   Lucretia Kern, DO    Family History Family History  Problem Relation Age of Onset  . Breast cancer Mother   . Colon polyps Father   . Barrett's esophagus Father   . Heart disease Other   . Cancer Other   . Colon cancer Other   . Diabetes Other   . Breast cancer Maternal Aunt   . Rectal cancer Neg Hx   . Stomach cancer Neg Hx   . Esophageal cancer Neg Hx     Social History Social History   Tobacco Use  . Smoking status: Never Smoker  . Smokeless tobacco: Never Used  Substance Use Topics  . Alcohol use: Yes    Comment: occasional wine  . Drug use: No     Allergies   Patient has no known allergies.   Review of Systems Review of Systems   Physical Exam Triage Vital Signs ED Triage Vitals [05/24/18 1443]  Enc Vitals Group     BP (!) 143/91     Pulse Rate 72     Resp 16     Temp      Temp Source Oral     SpO2 99 %     Weight      Height      Head Circumference      Peak Flow      Pain Score      Pain Loc      Pain Edu?      Excl. in [place]?    No data found.  Updated Vital Signs BP (!) 143/91 (BP Location: Right Arm)   Pulse 72   Resp 16   SpO2 99%   Visual Acuity Right Eye Distance:   Left Eye Distance:   Bilateral Distance:    Right Eye Near:   Left Eye Near:    Bilateral Near:     Physical Exam Constitutional:      General: She is not in acute distress.    Appearance: She is well-developed.  Cardiovascular:     Rate and Rhythm: Normal rate and regular rhythm.     Heart sounds: Normal heart sounds.  Pulmonary:     Effort: Pulmonary effort  is normal.  Breath sounds: Normal breath sounds.  Abdominal:     Tenderness: There is no right CVA tenderness or left CVA tenderness.  Musculoskeletal:     Lumbar back: She exhibits pain. She exhibits normal range of motion, no tenderness, no bony tenderness, no swelling, no edema, no deformity, no laceration, no spasm and normal pulse.       Back:     Comments: Right mid low back with pain, not reproducible with palpation; no pain with sit to lay or lay to sit transition; negative straight leg raise; no pain with hip flexion; strength equal bilaterally; gross sensation intact to lower extremities; ambulatory without difficulty   Skin:    General: Skin is warm and dry.  Neurological:     Mental Status: She is alert and oriented to person, place, and time.      UC Treatments / Results  Labs (all labs ordered are listed, but only abnormal results are displayed) Labs Reviewed  POCT URINALYSIS DIP (DEVICE)    EKG None  Radiology No results found.  Procedures Procedures (including critical care time)  Medications Ordered in UC Medications - No data to display  Initial Impression / Assessment and Plan / UC Course  I have reviewed the triage vital signs and the nursing notes.  Pertinent labs & imaging results that were available during my care of the patient were reviewed by me and considered in my medical decision making (see chart for details).     No hgb to urine today in clinic. Pain has improved. Severe spasms earlier today. Nephrolithiasis vs MSK considered and discussed. Return precautions provided. Patient verbalized understanding and agreeable to plan.  Ambulatory out of clinic without difficulty.   Final Clinical Impressions(s) / UC Diagnoses   Final diagnoses:  Acute right-sided low back pain without sciatica  Muscle spasm of back     Discharge Instructions     Your urine is normal here today which is reassuring.  Kidney stone vs muscular pain still to  be considered.  I have sent a muscle relaxer to the pharmacy you may use as needed, primarily at night.  I would recommend sleeping with pillow under the knees.  Tylenol as needed for pain as NSAIDs may cause stomach upset. If use ibuprofen or aleve take with your pepcid and with food.  If any worsening of symptoms please go to the ER.  If no improvement or persistent symptoms please follow up with your primary care provider.     ED Prescriptions    Medication Sig Dispense Auth. Provider   tiZANidine (ZANAFLEX) 2 MG tablet Take 1 tablet (2 mg total) by mouth at bedtime. 20 tablet Zigmund Gottron, NP     Controlled Substance Prescriptions [place] Controlled Substance Registry consulted? Not Applicable   Zigmund Gottron, NP 05/24/18 [phone]

## 2018-05-28 ENCOUNTER — Encounter: Payer: Self-pay | Admitting: Family Medicine

## 2018-05-28 ENCOUNTER — Ambulatory Visit (INDEPENDENT_AMBULATORY_CARE_PROVIDER_SITE_OTHER): Payer: BLUE CROSS/BLUE SHIELD | Admitting: Family Medicine

## 2018-05-28 ENCOUNTER — Other Ambulatory Visit: Payer: Self-pay

## 2018-05-28 DIAGNOSIS — R350 Frequency of micturition: Secondary | ICD-10-CM

## 2018-05-28 DIAGNOSIS — M545 Low back pain, unspecified: Secondary | ICD-10-CM

## 2018-05-28 DIAGNOSIS — K5900 Constipation, unspecified: Secondary | ICD-10-CM | POA: Diagnosis not present

## 2018-05-28 NOTE — Progress Notes (Signed)
Virtual Visit via Video Note  I connected with Erica Kramer  on 05/28/18 at  8:00 AM EDT by a video enabled telemedicine application and verified that I am speaking with the correct person using two identifiers.  Location patient: home Location provider:work or home office Persons participating in the virtual visit: patient, provider  I discussed the limitations of evaluation and management by telemedicine and the availability of in person appointments. The patient expressed understanding and agreed to proceed.   Erica Kramer DOB: 05-16-1955 Encounter date: 05/28/2018  This is a 63 y.o. female who presents with No chief complaint on file.   History of present illness: Seen 5/2 with back right sided low back pain. Some spasm. Mild radiation of pain to abdomen. Consideration for kidney stone but thought to be MSK. tizanidine sent in.   HPI  Doing a lot better now. Sat morning woke up and back immediately hurt. Then gradually worsened; couldn't bend over, couldn't pick up cat. Then combing hair had bad spasm. Never felt something that severe. Didn't have another one that severe. Just some pain since that but has been steadily improving. Still has occasional sharp pain in lower right back which comes and goes. Yesterday had a little pain in middle of back. Did take muscle relaxer the first night (which was great) and then again second night, but didn't take since then. Doesn't like to take medications if she doesn't need to. Used tylenol yesterday for pain which helped.   Has a lot of stomach cramping at baseline along with some chronic constipation. Not hurting like it was on Saturday. This didn't start on sat until 2 hours into back pain. Describes more like cramping, with some nausea. Also has some baseline acid issues which have been better.   No fevers, chills.   She is managing some gyn issues with creams. Sometimes feels some pressure with urination, but not dysuria, pain.   Gets up  2-3 times/night to go to bathroom. If she delays getting up, then when she goes to lay back down has some discomfort for about 30 minutes when she lays back down. No issues with leaking urine.   No Known Allergies No outpatient medications have been marked as taking for the 05/28/18 encounter (Office Visit) with Caren Macadam, MD.   Current Facility-Administered Medications for the 05/28/18 encounter (Office Visit) with Caren Macadam, MD  Medication  . 0.9 %  sodium chloride infusion    Review of Systems  Constitutional: Negative for chills, fatigue and fever.  Respiratory: Negative for cough, chest tightness, shortness of breath and wheezing.   Cardiovascular: Negative for chest pain, palpitations and leg swelling.  Genitourinary: Negative for decreased urine volume, difficulty urinating, dysuria, flank pain and urgency. Frequency: goes a couple of times at night.  Musculoskeletal: Positive for back pain (improving).    Objective:  There were no vitals taken for this visit.      BP Readings from Last 3 Encounters:  05/24/18 (!) 143/91  02/27/18 120/80  02/21/18 102/64   Wt Readings from Last 3 Encounters:  02/27/18 133 lb 11.2 oz (60.6 kg)  02/21/18 134 lb 9.6 oz (61.1 kg)  11/14/17 129 lb 1.6 oz (58.6 kg)    EXAM:  GENERAL: alert, oriented, appears well and in no acute distress  HEENT: atraumatic, conjunctiva clear, no obvious abnormalities on inspection of external nose and ears  NECK: normal movements of the head and neck  LUNGS: on inspection no signs of respiratory  distress, breathing rate appears normal, no obvious gross SOB, gasping or wheezing  CV: no obvious cyanosis  MS: moves all visible extremities without noticeable abnormality  PSYCH/NEURO: pleasant and cooperative, no obvious depression or anxiety, speech and thought processing grossly intact   Assessment/Plan  1. Acute right-sided low back pain without sciatica Improving; discussed home  stretches and working on core strengthening once pain has improved. Let us know if worsening or new concerns arise.  2. Urinary frequency Discussed working on food/drinks that do not aggravate diet (look up IC diet but told not IC). Frequent emptying during day. Limit evening fluids. If pressure continues could get urine culture, but reassured normal UA. Work on [employer] for strength.  3. Constipation, unspecified constipation type Continue to work on achieving regular bowel movements.  Return in about 6 months (around 11/28/2018) for physical exam.      I discussed the assessment and treatment plan with the patient. The patient was provided an opportunity to ask questions and all were answered. The patient agreed with the plan and demonstrated an understanding of the instructions.   The patient was advised to call back or seek an in-person evaluation if the symptoms worsen or if the condition fails to improve as anticipated.  I provided 30 minutes of non-face-to-face time during this encounter.   Micheline Rough, MD

## 2018-06-30 ENCOUNTER — Other Ambulatory Visit: Payer: Self-pay | Admitting: Obstetrics and Gynecology

## 2018-06-30 DIAGNOSIS — M8580 Other specified disorders of bone density and structure, unspecified site: Secondary | ICD-10-CM

## 2018-07-08 ENCOUNTER — Ambulatory Visit
Admission: RE | Admit: 2018-07-08 | Discharge: 2018-07-08 | Disposition: A | Discharge status: Home / Self Care | Payer: BC Managed Care – PPO | Source: Ambulatory Visit | Attending: Obstetrics and Gynecology | Admitting: Obstetrics and Gynecology

## 2018-07-08 ENCOUNTER — Other Ambulatory Visit: Payer: Self-pay

## 2018-07-08 DIAGNOSIS — M8580 Other specified disorders of bone density and structure, unspecified site: Secondary | ICD-10-CM

## 2018-07-16 ENCOUNTER — Other Ambulatory Visit: Payer: Self-pay

## 2018-07-16 ENCOUNTER — Ambulatory Visit
Admission: RE | Admit: 2018-07-16 | Discharge: 2018-07-16 | Disposition: A | Discharge status: Home / Self Care | Payer: BC Managed Care – PPO | Source: Ambulatory Visit | Attending: Obstetrics and Gynecology | Admitting: Obstetrics and Gynecology

## 2018-07-16 DIAGNOSIS — Z1231 Encounter for screening mammogram for malignant neoplasm of breast: Secondary | ICD-10-CM

## 2018-10-29 ENCOUNTER — Ambulatory Visit (INDEPENDENT_AMBULATORY_CARE_PROVIDER_SITE_OTHER): Payer: Medicare Other | Admitting: Family Medicine

## 2018-10-29 ENCOUNTER — Other Ambulatory Visit: Payer: Self-pay

## 2018-10-29 ENCOUNTER — Encounter: Payer: Self-pay | Admitting: Family Medicine

## 2018-10-29 VITALS — BP 100/80 | HR 75 | Temp 98.1°F | Ht 64.25 in | Wt 141.4 lb

## 2018-10-29 DIAGNOSIS — B0221 Postherpetic geniculate ganglionitis: Secondary | ICD-10-CM

## 2018-10-29 DIAGNOSIS — B351 Tinea unguium: Secondary | ICD-10-CM

## 2018-10-29 DIAGNOSIS — Z0000 Encounter for general adult medical examination without abnormal findings: Secondary | ICD-10-CM

## 2018-10-29 DIAGNOSIS — R7301 Impaired fasting glucose: Secondary | ICD-10-CM

## 2018-10-29 DIAGNOSIS — Z23 Encounter for immunization: Secondary | ICD-10-CM

## 2018-10-29 DIAGNOSIS — E785 Hyperlipidemia, unspecified: Secondary | ICD-10-CM | POA: Diagnosis not present

## 2018-10-29 LAB — COMPREHENSIVE METABOLIC PANEL
ALT: 15 U/L (ref 0–35)
AST: 16 U/L (ref 0–37)
Albumin: 4.3 g/dL (ref 3.5–5.2)
Alkaline Phosphatase: 77 U/L (ref 39–117)
BUN: 12 mg/dL (ref 6–23)
CO2: 31 mEq/L (ref 19–32)
Calcium: 9.5 mg/dL (ref 8.4–10.5)
Chloride: 103 mEq/L (ref 96–112)
Creatinine, Ser: 0.8 mg/dL (ref 0.40–1.20)
GFR: 71.96 mL/min (ref >60.00)
Glucose, Bld: 79 mg/dL (ref 70–99)
Potassium: 4.7 mEq/L (ref 3.5–5.1)
Sodium: 140 mEq/L (ref 135–145)
Total Bilirubin: 0.6 mg/dL (ref 0.2–1.2)
Total Protein: 6.8 g/dL (ref 6.0–8.3)

## 2018-10-29 LAB — HEMOGLOBIN A1C: Hgb A1c MFr Bld: 5.8 % (ref 4.6–6.5)

## 2018-10-29 LAB — LIPID PANEL
Cholesterol: 204 mg/dL — ABNORMAL HIGH (ref 0–200)
HDL: 72.8 mg/dL (ref >39.00)
LDL Cholesterol: 111 mg/dL — ABNORMAL HIGH (ref 0–99)
NonHDL: 131.52
Total CHOL/HDL Ratio: 3
Triglycerides: 102 mg/dL (ref 0.0–149.0)
VLDL: 20.4 mg/dL (ref 0.0–40.0)

## 2018-10-29 LAB — TSH: TSH: 1.81 u[IU]/mL (ref 0.35–4.50)

## 2018-10-29 MED ORDER — CICLOPIROX 8 % EX SOLN: CUTANEOUS | 5 refills | Status: DC

## 2018-10-29 MED ORDER — SHINGRIX 50 MCG/0.5ML IM SUSR: 0.5000 mL | Freq: Once | INTRAMUSCULAR | 0 refills | Status: AC

## 2018-10-29 NOTE — Progress Notes (Signed)
Erica Kramer DOB: 1955-08-23 Encounter date: 10/29/2018  This is a 63 y.o. female who presents for complete physical   History of present illness/Additional concerns: Last visit was for follow up of back pain (05/28/18). Back is great now. She really did all stretching exercises and has been continuing to do these. Only needed muscle relaxers for a couple of days.   Frustrated with weight gain. After COVID hitting, she gained a little more. 3 weeks ago she started to eat healthier, exercise more, cut out carbs. Harder to get weight off.   Gets pain in ear on and off and when she takes acylovir for a couple of doses it goes away. Maybe 3 times/year. Starts with itching. Had the problem for years and didn't get any help and it would become very severe. Does get frequent ear aches. If she takes claritin for 2-3 days then earache goes away in a few days. Has had to get wax out in past; has been more recent issue.   Has had issues with toenail fungus in past; has done well before with penlac, would like this again - now just big toe both feet.   Does pap and mammogram and bone density with gyn.  Last colonoscopy 04/2016: recall 04/2019 Last bloodwork was 10/2017; mild total cholesterol. A1C was 5.7 Does see dermatology regularly. Has had a few things frozen.  Went to vein center before for spider veins, but now wondering about going back. Not sure if medical concern at this point or just cosmetic. Has one that stings a little. Not all the time.   Had bad reaction after shingles shot - felt terrible for 3 days.   Past Medical History:  Diagnosis Date  . Allergy    mild  . Anemia    as child only  . Anxiety    no medicines  . Chronic frontal sinusitis 01/28/2007   Qualifier: Diagnosis of  By: Arnoldo Morale MD, [place], ADENOMATOUS, HX OF 08/30/2008   Qualifier: Diagnosis of  By: Harlon Ditty CMA (AAMA), Dottie    . Constipation    diet controlled mainly  . Diverticulosis, acute    . DJD (degenerative joint disease)   . GERD 09/02/2008   Qualifier: Diagnosis of  By: Fuller Plan MD Marijo Conception Glaucoma   . Headache(784.0)   . HEMATOCHEZIA 09/02/2008   Qualifier: Diagnosis of  By: Fuller Plan MD Marijo Conception Macular degeneration syndrome   . MONILIASIS, SKIN/NAILS 01/28/2007   Qualifier: Diagnosis of  By: Arnoldo Morale MD, John E   . Neck pain, chronic   . Osteopenia   . PONV (postoperative nausea and vomiting)   . Rosacea   . TMJ (temporomandibular joint disorder)    Past Surgical History:  Procedure Laterality Date  . ABDOMINAL HYSTERECTOMY    . APPENDECTOMY    . COLONOSCOPY    . LAPAROSCOPIC APPENDECTOMY  03/12/2011   Procedure: APPENDECTOMY LAPAROSCOPIC;  Surgeon: Merrie Roof, MD;  Location: [facility];  Service: General;  Laterality: N/A;  . POLYPECTOMY    . TONSILLECTOMY     No Known Allergies Current Meds  Medication Sig  . calcium-vitamin D (OSCAL WITH D) 500-200 MG-UNIT per tablet Take 1 tablet by mouth daily. Takes on and off  . ciclopirox (PENLAC) 8 % solution USE ON NAIL & SURROUNDING SKIN AT BED. USE DAILY OVER PREVIOUS COAT X7DAYS, REMOVE W/ALCOHOL RESTART  . clindamycin (CLEOCIN) 2 % vaginal cream INSERT  1 APPLICATORFUL AT BEDTIME  . ergocalciferol (VITAMIN D2) 50000 UNITS capsule Take 50,000 Units by mouth once a week.  . estradiol (ESTRACE) 0.1 MG/GM vaginal cream Place 1 Applicatorful vaginally as needed.  Marland Kitchen estradiol (VIVELLE-DOT) 0.0375 MG/24HR Place 1 patch onto the skin once a week. Apply once a week  . fluticasone (FLONASE) 50 MCG/ACT nasal spray Place into both nostrils daily.  Marland Kitchen latanoprost (XALATAN) 0.005 % ophthalmic solution Place 1 drop into both eyes at bedtime.    Marland Kitchen loratadine (CLARITIN) 10 MG tablet Take 10 mg by mouth daily as needed.   . mometasone (ELOCON) 0.1 % lotion Apply topically daily.  . mupirocin ointment (BACTROBAN) 2 % Place 1 application into the nose 2 (two) times daily.  Marland Kitchen nystatin-triamcinolone ointment (MYCOLOG)  APPLY TO AFFECTED AREA TWICE A DAY  . tiZANidine (ZANAFLEX) 2 MG tablet Take 1 tablet (2 mg total) by mouth at bedtime.  . valACYclovir (VALTREX) 500 MG tablet Take 1 tablet (500 mg total) by mouth daily.  . [DISCONTINUED] ciclopirox (PENLAC) 8 % solution USE ON NAIL & SURROUNDING SKIN AT BED. USE DAILY OVER PREVIOUS COAT X7DAYS, REMOVE W/ALCOHOL RESTART   Current Facility-Administered Medications for the 10/29/18 encounter (Office Visit) with Caren Macadam, MD  Medication  . 0.9 %  sodium chloride infusion   Social History   Tobacco Use  . Smoking status: Never Smoker  . Smokeless tobacco: Never Used  Substance Use Topics  . Alcohol use: Yes    Comment: occasional wine   Family History  Problem Relation Age of Onset  . Breast cancer Mother   . Colon polyps Father   . Barrett's esophagus Father   . Heart disease Other   . Cancer Other   . Colon cancer Other   . Diabetes Other   . Breast cancer Maternal Aunt   . Rectal cancer Neg Hx   . Stomach cancer Neg Hx   . Esophageal cancer Neg Hx      Review of Systems  Constitutional: Negative for activity change, appetite change, chills, fatigue, fever and unexpected weight change.  HENT: Negative for congestion, ear pain, hearing loss, sinus pressure, sinus pain, sore throat and trouble swallowing.   Eyes: Negative for pain and visual disturbance.  Respiratory: Negative for cough, chest tightness, shortness of breath and wheezing.   Cardiovascular: Negative for chest pain, palpitations and leg swelling.  Gastrointestinal: Negative for abdominal pain, blood in stool, constipation, diarrhea, nausea and vomiting.  Genitourinary: Negative for difficulty urinating and menstrual problem.  Musculoskeletal: Negative for arthralgias and back pain.  Skin: Negative for rash.  Neurological: Negative for dizziness, weakness, numbness and headaches.  Hematological: Negative for adenopathy. Does not bruise/bleed easily.   Psychiatric/Behavioral: Negative for sleep disturbance and suicidal ideas. The patient is not nervous/anxious.     CBC:  Lab Results  Component Value Date   WBC 4.9 03/03/2015   HGB 14.4 03/03/2015   HCT 43.0 03/03/2015   MCHC 33.4 03/03/2015   RDW 13.2 03/03/2015   PLT 209.0 03/03/2015   CMP: Lab Results  Component Value Date   NA 140 10/29/2018   K 4.7 10/29/2018   CL 103 10/29/2018   CO2 31 10/29/2018   GLUCOSE 79 10/29/2018   BUN 12 10/29/2018   CREATININE 0.80 10/29/2018   GFRAA 113 01/06/2007   CALCIUM 9.5 10/29/2018   PROT 6.8 10/29/2018   BILITOT 0.6 10/29/2018   ALKPHOS 77 10/29/2018   ALT 15 10/29/2018   AST 16 10/29/2018  LIPID: Lab Results  Component Value Date   CHOL 204 (H) 10/29/2018   TRIG 102.0 10/29/2018   HDL 72.80 10/29/2018   LDLCALC 111 (H) 10/29/2018    Objective:  BP 100/80 (BP Location: Left Arm, Patient Position: Sitting, Cuff Size: Normal)   Pulse 75   Temp 98.1 F (36.7 C) (Temporal)   Ht 5' 4.25" (1.632 m)   Wt 141 lb 6.4 oz (64.1 kg)   SpO2 98%   BMI 24.08 kg/m   Weight: 141 lb 6.4 oz (64.1 kg)   BP Readings from Last 3 Encounters:  10/29/18 100/80  05/24/18 (!) 143/91  02/27/18 120/80   Wt Readings from Last 3 Encounters:  10/29/18 141 lb 6.4 oz (64.1 kg)  02/27/18 133 lb 11.2 oz (60.6 kg)  02/21/18 134 lb 9.6 oz (61.1 kg)    Physical Exam Constitutional:      General: She is not in acute distress.    Appearance: She is well-developed.  HENT:     Head: Normocephalic and atraumatic.     Right Ear: External ear normal.     Left Ear: External ear normal.     Mouth/Throat:     Pharynx: No oropharyngeal exudate.  Eyes:     Conjunctiva/sclera: Conjunctivae normal.     Pupils: Pupils are equal, round, and reactive to light.  Neck:     Musculoskeletal: Normal range of motion and neck supple.     Thyroid: No thyromegaly.  Cardiovascular:     Rate and Rhythm: Normal rate and regular rhythm.     Heart sounds:  Normal heart sounds. No murmur. No friction rub. No gallop.   Pulmonary:     Effort: Pulmonary effort is normal.     Breath sounds: Normal breath sounds.  Abdominal:     General: Bowel sounds are normal. There is no distension.     Palpations: Abdomen is soft. There is no mass.     Tenderness: There is no abdominal tenderness. There is no guarding.     Hernia: No hernia is present.  Musculoskeletal: Normal range of motion.        General: No tenderness or deformity.  Lymphadenopathy:     Cervical: No cervical adenopathy.  Skin:    General: Skin is warm and dry.     Findings: No rash.     Comments: Great toenails bilat are yellowed - but this seems to be effect of nail polish. Left great toenail slightly thickened medially.  Neurological:     Mental Status: She is alert and oriented to person, place, and time.     Deep Tendon Reflexes: Reflexes normal.     Reflex Scores:      Tricep reflexes are 2+ on the right side and 2+ on the left side.      Bicep reflexes are 2+ on the right side and 2+ on the left side.      Brachioradialis reflexes are 2+ on the right side and 2+ on the left side.      Patellar reflexes are 2+ on the right side and 2+ on the left side. Psychiatric:        Speech: Speech normal.        Behavior: Behavior normal.        Thought Content: Thought content normal.     Assessment/Plan: There are no preventive care reminders to display for this patient. Health Maintenance reviewed.  1. Preventative health care Up to date with HM items. Will consider second  shingle vaccine at pharmacy, but needs to schedule at time when she doesn't have activities for a few days since last one made her feel poorly.  2. Need for pneumococcal vaccination comleted today. - Pneumococcal polysaccharide vaccine 23-valent greater than or equal to 2yo subcutaneous/IM  3. Hyperlipidemia, unspecified hyperlipidemia type - Comprehensive metabolic panel; Future - Lipid panel; Future -  TSH; Future - TSH - Lipid panel - Comprehensive metabolic panel  4. Elevated fasting glucose - Hemoglobin A1c; Future - Hemoglobin A1c  5. Herpes zoster of the ear Does well with valtrex.   6. Need for shingles vaccine - Zoster Vaccine Adjuvanted [employer]) injection; Inject 0.5 mLs into the muscle once for 1 dose. *patient had first dose already; this will be her second dose (first dose was 10/2017)  Dispense: 0.5 mL; Refill: 0  7. Onychomycosis Has done well with penlac in past. Advised to keep nails unpolished. - ciclopirox (PENLAC) 8 % solution; USE ON NAIL & SURROUNDING SKIN AT BED. USE DAILY OVER PREVIOUS COAT X7DAYS, REMOVE W/ALCOHOL RESTART  Dispense: 6.6 mL; Refill: 5  8. Need for immunization against influenza - Flu Vaccine QUAD High Dose(Fluad)  Return in about 6 months (around 04/29/2019) for Chronic condition visit.  Micheline Rough, MD

## 2018-12-22 DIAGNOSIS — H18513 Endothelial corneal dystrophy, bilateral: Secondary | ICD-10-CM | POA: Diagnosis not present

## 2018-12-22 DIAGNOSIS — H2513 Age-related nuclear cataract, bilateral: Secondary | ICD-10-CM | POA: Diagnosis not present

## 2018-12-22 DIAGNOSIS — H40013 Open angle with borderline findings, low risk, bilateral: Secondary | ICD-10-CM | POA: Diagnosis not present

## 2018-12-22 DIAGNOSIS — H04123 Dry eye syndrome of bilateral lacrimal glands: Secondary | ICD-10-CM | POA: Diagnosis not present

## 2019-01-05 DIAGNOSIS — Z0120 Encounter for dental examination and cleaning without abnormal findings: Secondary | ICD-10-CM | POA: Diagnosis not present

## 2019-01-27 DIAGNOSIS — N952 Postmenopausal atrophic vaginitis: Secondary | ICD-10-CM | POA: Diagnosis not present

## 2019-01-27 DIAGNOSIS — M8580 Other specified disorders of bone density and structure, unspecified site: Secondary | ICD-10-CM | POA: Diagnosis not present

## 2019-01-27 DIAGNOSIS — Z01419 Encounter for gynecological examination (general) (routine) without abnormal findings: Secondary | ICD-10-CM | POA: Diagnosis not present

## 2019-01-27 DIAGNOSIS — Z6823 Body mass index (BMI) 23.0-23.9, adult: Secondary | ICD-10-CM | POA: Diagnosis not present

## 2019-03-14 ENCOUNTER — Ambulatory Visit: Payer: Medicare Other | Attending: Internal Medicine

## 2019-03-14 DIAGNOSIS — Z23 Encounter for immunization: Secondary | ICD-10-CM

## 2019-03-14 NOTE — Progress Notes (Signed)
   Covid-19 Vaccination Clinic  Name:  Desirree Inwood    MRN: [id number] DOB: [birth year]  03/14/2019  Ms. Trantham was observed post Covid-19 immunization for 15 minutes without incidence. She was provided with Vaccine Information Sheet and instruction to access the V-Safe system.   Ms. Cryer was instructed to call 911 with any severe reactions post vaccine: Marland Kitchen Difficulty breathing  . Swelling of your face and throat  . A fast heartbeat  . A bad rash all over your body  . Dizziness and weakness    Immunizations Administered    Name Date Dose VIS Date Route   Pfizer COVID-19 Vaccine 03/14/2019  2:31 PM 0.3 mL 01/02/2019 Intramuscular   Manufacturer: [place]   Lot: [id number]   [place]: [id number]

## 2019-03-24 NOTE — Progress Notes (Signed)
Erica Kramer DOB: 31-Dec-1955 Encounter date: 03/25/2019  This is a 64 y.o. female who presents with Chief Complaint  Patient presents with  . Chest Pain    right-sided intermittently x3 weeks, initially painful to the touch and recently radiates to breast also, suspected muscle strain    History of present illness: Week 4. At first thought pulled muscle on right side. Had similar experience years ago that went away on own. Got worse and just got stuck. Didn't feel well that week; just a little sick. By week 2 stay localized, but then started to get pain radiating around breast. Then week 3 starts at same area with some radiation to middle. Has had a few nights where left side started aching a little.   Looked things up on internet and of course cancers were listed.   This is best week and has improved since making appointment on Monday.   Has had some shoulder pain; has felt some weirdness/tension/squeezing on top of arms.   No shortness of breath, no dizziness, no vomiting.   Never had endoscopy with colonoscopy; her colonoscopy is due in April. Occasionally takes pepcid and didn't note much improvement with this. Does occasionally have some heartburn. Took tylenol on Sunday which helped a little, but in 2 hours felt like it was right back.   Never addressed issue of GERD because this was diagnosed when she had presented with abdominal pain and had appendectomy that night. Had a few more issues with noting heartburn lately, but nothing where she felt she needed to follow up.   Eating healthier lately - more natural, fruits, veggies, whole grain.   Even when pain started it was intermittent in chest. Not hurting at night at all. Usually within being up then pain started. Hasn't noticed relationship with activity. Not noted any change that could make it resolve.   2-3 times felt like she swallowed something that gave some discomfort on the way down. Not sure if just more in tune with  body.   Thinks there was possibility of lifting something heavy that strained chest. No associated shoulder or neck pain. No obvious injury. Thinks she woke with this one day.   Breast radiation has improved as overal sx improved.   Right now states that pain is 2/10 and at worst was 6/10 (just because more dull ache). Never stopped her activity level.     No Known Allergies Current Meds  Medication Sig  . calcium-vitamin D (OSCAL WITH D) 500-200 MG-UNIT per tablet Take 1 tablet by mouth daily. Takes on and off  . ciclopirox (PENLAC) 8 % solution USE ON NAIL & SURROUNDING SKIN AT BED. USE DAILY OVER PREVIOUS COAT X7DAYS, REMOVE W/ALCOHOL RESTART  . clindamycin (CLEOCIN) 2 % vaginal cream INSERT 1 APPLICATORFUL AT BEDTIME  . ergocalciferol (VITAMIN D2) 50000 UNITS capsule Take 50,000 Units by mouth once a week.  . estradiol (ESTRACE) 0.1 MG/GM vaginal cream Place 1 Applicatorful vaginally as needed.  Marland Kitchen estradiol (VIVELLE-DOT) 0.0375 MG/24HR Place 1 patch onto the skin once a week. Apply once a week  . fluticasone (FLONASE) 50 MCG/ACT nasal spray Place into both nostrils daily.  Marland Kitchen latanoprost (XALATAN) 0.005 % ophthalmic solution Place 1 drop into both eyes at bedtime.    Marland Kitchen loratadine (CLARITIN) 10 MG tablet Take 10 mg by mouth daily as needed.   . mometasone (ELOCON) 0.1 % lotion Apply topically daily.  . mupirocin ointment (BACTROBAN) 2 % Place 1 application into the nose 2 (two)  times daily.  Marland Kitchen nystatin-triamcinolone ointment (MYCOLOG) APPLY TO AFFECTED AREA TWICE A DAY  . tiZANidine (ZANAFLEX) 2 MG tablet Take 1 tablet (2 mg total) by mouth at bedtime.  . valACYclovir (VALTREX) 500 MG tablet Take 1 tablet (500 mg total) by mouth daily.   Current Facility-Administered Medications for the 03/25/19 encounter (Office Visit) with Caren Macadam, MD  Medication  . 0.9 %  sodium chloride infusion    Review of Systems  Constitutional: Negative for chills, fatigue and fever.   Respiratory: Negative for cough, chest tightness, shortness of breath and wheezing.   Cardiovascular: Positive for chest pain (see hpi). Negative for palpitations and leg swelling.  Gastrointestinal: Negative for abdominal pain, blood in stool, constipation, diarrhea, nausea and vomiting.  Genitourinary: Negative for difficulty urinating and dysuria.    Objective:  BP 90/60 (BP Location: Left Arm, Patient Position: Sitting, Cuff Size: Normal)   Pulse 89   Temp (!) 97.3 F (36.3 C) (Temporal)   Ht 5' 4.25" (1.632 m)   Wt 142 lb 1.6 oz (64.5 kg)   SpO2 98%   BMI 24.20 kg/m   Weight: 142 lb 1.6 oz (64.5 kg)   BP Readings from Last 3 Encounters:  03/25/19 90/60  10/29/18 100/80  05/24/18 (!) 143/91   Wt Readings from Last 3 Encounters:  03/25/19 142 lb 1.6 oz (64.5 kg)  10/29/18 141 lb 6.4 oz (64.1 kg)  02/27/18 133 lb 11.2 oz (60.6 kg)    Physical Exam Constitutional:      General: She is not in acute distress.    Appearance: She is well-developed.  Cardiovascular:     Rate and Rhythm: Normal rate and regular rhythm.     Heart sounds: Normal heart sounds. No murmur. No friction rub.  Pulmonary:     Effort: Pulmonary effort is normal. No respiratory distress.     Breath sounds: Normal breath sounds. No decreased breath sounds, wheezing, rhonchi or rales.  Chest:     Breasts:        Right: Normal.        Left: Normal.       Comments: Tenderness to palpation anterior right 3rd-4th rib.  Palpation of this area does reproduce patient's described pain. Musculoskeletal:     Right lower leg: No edema.     Left lower leg: No edema.  Lymphadenopathy:     Upper Body:     Right upper body: No supraclavicular, axillary or pectoral adenopathy.     Left upper body: No supraclavicular, axillary or pectoral adenopathy.  Neurological:     Mental Status: She is alert and oriented to person, place, and time.  Psychiatric:        Behavior: Behavior normal.     Assessment/Plan   1. Right-sided chest pain Pain is reproducible on exam.  No other abnormalities palpated or red flags in her history.  We discussed etiologies for tenderness including chest wall inflammation.  X-ray will be obtained today.  Since pain is been improving, I feel that x-ray is normal we can monitor pain with a check in 2 weeks time.  If any worsening of pain or new symptoms emerge, she will let me know.  At one point, she did have radiation of pain to the right breast, so if this recurs we can consider diagnostic mammogram.  Of note, she has had a normal mammogram within the year. - DG Chest 2 View; Future - DG Chest 2 View   Return for pending xr  results.    Micheline Rough, MD

## 2019-03-25 ENCOUNTER — Encounter: Payer: Self-pay | Admitting: Family Medicine

## 2019-03-25 ENCOUNTER — Ambulatory Visit (INDEPENDENT_AMBULATORY_CARE_PROVIDER_SITE_OTHER): Payer: Medicare Other

## 2019-03-25 ENCOUNTER — Other Ambulatory Visit: Payer: Self-pay

## 2019-03-25 ENCOUNTER — Ambulatory Visit (INDEPENDENT_AMBULATORY_CARE_PROVIDER_SITE_OTHER): Payer: Medicare Other | Admitting: Family Medicine

## 2019-03-25 VITALS — BP 90/60 | HR 89 | Temp 97.3°F | Ht 64.25 in | Wt 142.1 lb

## 2019-03-25 DIAGNOSIS — R079 Chest pain, unspecified: Secondary | ICD-10-CM

## 2019-03-30 ENCOUNTER — Encounter: Payer: Self-pay | Admitting: Family Medicine

## 2019-03-31 ENCOUNTER — Telehealth: Payer: Self-pay | Admitting: Gastroenterology

## 2019-03-31 NOTE — Telephone Encounter (Signed)
Patient is calling with concerns states she has been having chest pain, she had an xray done but its doesn't show anything wrong.

## 2019-03-31 NOTE — Telephone Encounter (Signed)
Patient with GERD and CP she wants to be seen earlier than her scheduled appt with Dr. Fuller Plan on 4/14.  She is rescheduled to come see Alonza Bogus, PA on 04/09/19

## 2019-04-07 ENCOUNTER — Ambulatory Visit: Payer: Medicare Other | Attending: Internal Medicine

## 2019-04-07 DIAGNOSIS — Z23 Encounter for immunization: Secondary | ICD-10-CM

## 2019-04-07 NOTE — Progress Notes (Signed)
   Covid-19 Vaccination Clinic  Name:  Janene Quartuccio    MRN: [id number] DOB: 1955/12/21  04/07/2019  Ms. Folger was observed post Covid-19 immunization for 15 minutes without incident. She was provided with Vaccine Information Sheet and instruction to access the V-Safe system.   Ms. Legro was instructed to call 911 with any severe reactions post vaccine: Marland Kitchen Difficulty breathing  . Swelling of face and throat  . A fast heartbeat  . A bad rash all over body  . Dizziness and weakness   Immunizations Administered    Name Date Dose VIS Date Route   Pfizer COVID-19 Vaccine 04/07/2019  1:45 PM 0.3 mL 01/02/2019 Intramuscular   Manufacturer: [place]   Lot: [id number]   [place]: [id number]

## 2019-04-09 ENCOUNTER — Encounter: Payer: Self-pay | Admitting: Gastroenterology

## 2019-04-09 ENCOUNTER — Ambulatory Visit: Payer: Medicare Other | Admitting: Gastroenterology

## 2019-04-09 VITALS — BP 108/68 | HR 81 | Temp 98.5°F | Ht 64.25 in | Wt 140.0 lb

## 2019-04-09 DIAGNOSIS — Z86010 Personal history of colonic polyps: Secondary | ICD-10-CM | POA: Diagnosis not present

## 2019-04-09 DIAGNOSIS — K219 Gastro-esophageal reflux disease without esophagitis: Secondary | ICD-10-CM | POA: Diagnosis not present

## 2019-04-09 DIAGNOSIS — R0789 Other chest pain: Secondary | ICD-10-CM

## 2019-04-09 MED ORDER — NA SULFATE-K SULFATE-MG SULF 17.5-3.13-1.6 GM/177ML PO SOLN: 1.0000 | Freq: Once | ORAL | 0 refills | Status: AC

## 2019-04-09 MED ORDER — ONDANSETRON 4 MG PO TBDP: 4.0000 mg | ORAL_TABLET | Freq: Three times a day (TID) | ORAL | 0 refills | Status: DC | PRN

## 2019-04-09 NOTE — Patient Instructions (Addendum)
If you are age 66 or older, your body mass index should be between 23-30. Your Body mass index is 23.84 kg/m. If this is out of the aforementioned range listed, please consider follow up with your Primary Care Provider.  If you are age 64 or younger, your body mass index should be between 19-25. Your Body mass index is 23.84 kg/m. If this is out of the aformentioned range listed, please consider follow up with your Primary Care Provider.   Pepcid twice daily in the morning and at bedtime.  Zofran 4 mg ODT one tablet before each prep for upcoming colonoscopy.   You have been scheduled for a colonoscopy. Please follow written instructions given to you at your visit today.  Please pick up your prep supplies at the pharmacy within the next 1-3 days. If you use inhalers (even only as needed), please bring them with you on the day of your procedure.

## 2019-04-09 NOTE — Progress Notes (Signed)
04/09/2019 [name] [id number] August 12, 1955   HISTORY OF PRESENT ILLNESS: This is a pleasant 64 year old female who is a patient of Dr. Lynne Leader.  She is here today for evaluation regarding complaints of chest pain.  She reports intermittent dull ache in her chest for over a month.  She says it is more so to the right side of her chest.  She says it is about a 5 out of 10 at its worst.  Coincidentally she has noticed an increase in her nighttime acid reflux recently as well although very mild increase.  Currently she only uses Pepcid 20 mg as needed.  She says that she usually takes it when she wakes up in the middle the night with acid reflux.  Sounds like she has been using that a few times a week recently.  Says she says that sometimes she will go a week or so without needing it at all.  She tells me that she had been on omeprazole previously for a short time, sounds like just over-the-counter, but says that it caused her abdominal pain and made her feel worse.  She says that she discussed doing an endoscopy with Dr. Fuller Plan previously due to family history of Barrett's esophagus, but she has never proceeded.  She denies any dysphagia.  She admits to some mild nausea.  Her last colonoscopy was in April 2018 at which time she had adenomatous polyps removed.  Repeat was recommended at a 3-year interval.   Past Medical History:  Diagnosis Date  . Allergy    mild  . Anemia    as child only  . Anxiety    no medicines  . Chronic frontal sinusitis 01/28/2007   Qualifier: Diagnosis of  By: Arnoldo Morale MD, [place], ADENOMATOUS, HX OF 08/30/2008   Qualifier: Diagnosis of  By: Harlon Ditty CMA (AAMA), [place]    . Constipation    diet controlled mainly  . Diverticulosis, acute   . DJD (degenerative joint disease)   . GERD 09/02/2008   Qualifier: Diagnosis of  By: Fuller Plan MD Marijo Conception Glaucoma   . Headache(784.0)   . HEMATOCHEZIA 09/02/2008   Qualifier: Diagnosis of  By:  Fuller Plan MD Marijo Conception Macular degeneration syndrome   . MONILIASIS, SKIN/NAILS 01/28/2007   Qualifier: Diagnosis of  By: Arnoldo Morale MD, John E   . Neck pain, chronic   . Osteopenia   . PONV (postoperative nausea and vomiting)   . Rosacea   . TMJ (temporomandibular joint disorder)    Past Surgical History:  Procedure Laterality Date  . ABDOMINAL HYSTERECTOMY    . APPENDECTOMY    . COLONOSCOPY    . LAPAROSCOPIC APPENDECTOMY  03/12/2011   Procedure: APPENDECTOMY LAPAROSCOPIC;  Surgeon: Merrie Roof, MD;  Location: [facility];  Service: General;  Laterality: N/A;  . POLYPECTOMY    . TONSILLECTOMY      reports that she has never smoked. She has never used smokeless tobacco. She reports current alcohol use. She reports that she does not use drugs. family history includes Barrett's esophagus in her father; Breast cancer in her maternal aunt and mother; Cancer in an other family member; Colon cancer in an other family member; Colon polyps in her father; Diabetes in an other family member; Heart disease in an other family member. No Known Allergies    Outpatient Encounter Medications as of 04/09/2019  Medication Sig  . calcium-vitamin D (OSCAL  WITH D) 500-200 MG-UNIT per tablet Take 1 tablet by mouth daily. Takes on and off  . ciclopirox (PENLAC) 8 % solution USE ON NAIL & SURROUNDING SKIN AT BED. USE DAILY OVER PREVIOUS COAT X7DAYS, REMOVE W/ALCOHOL RESTART  . clindamycin (CLEOCIN) 2 % vaginal cream INSERT 1 APPLICATORFUL AT BEDTIME  . ergocalciferol (VITAMIN D2) 50000 UNITS capsule Take 50,000 Units by mouth once a week.  . estradiol (ESTRACE) 0.1 MG/GM vaginal cream Place 1 Applicatorful vaginally as needed.  Marland Kitchen estradiol (VIVELLE-DOT) 0.0375 MG/24HR Place 1 patch onto the skin once a week. Apply once a week  . fluticasone (FLONASE) 50 MCG/ACT nasal spray Place into both nostrils daily.  Marland Kitchen latanoprost (XALATAN) 0.005 % ophthalmic solution Place 1 drop into both eyes at bedtime.    Marland Kitchen  loratadine (CLARITIN) 10 MG tablet Take 10 mg by mouth daily as needed.   . mometasone (ELOCON) 0.1 % lotion Apply topically daily.  . mupirocin ointment (BACTROBAN) 2 % Place 1 application into the nose 2 (two) times daily.  Marland Kitchen nystatin-triamcinolone ointment (MYCOLOG) APPLY TO AFFECTED AREA TWICE A DAY  . tiZANidine (ZANAFLEX) 2 MG tablet Take 1 tablet (2 mg total) by mouth at bedtime.  . valACYclovir (VALTREX) 500 MG tablet Take 1 tablet (500 mg total) by mouth daily.   Facility-Administered Encounter Medications as of 04/09/2019  Medication  . 0.9 %  sodium chloride infusion     REVIEW OF SYSTEMS  : All other systems reviewed and negative except where noted in the History of Present Illness.   PHYSICAL EXAM: BP 108/68   Pulse 81   Temp 98.5 F (36.9 C)   Ht 5' 4.25" (1.632 m)   Wt 140 lb (63.5 kg)   BMI 23.84 kg/m  General: Well developed white female in no acute distress Head: Normocephalic and atraumatic Eyes:  Sclerae anicteric, conjunctiva pink. Ears: Normal auditory acuity Lungs: Clear throughout to auscultation; no increased WOB. Heart: Regular rate and rhythm; no M/R/G. Abdomen: Soft, non-distended.  BS present.  Non-tender. Rectal:  Will be done at the time of colonoscopy. Musculoskeletal: Symmetrical with no gross deformities  Skin: No lesions on visible extremities Extremities: No edema  Neurological: Alert oriented x 4, grossly non-focal Psychological:  Alert and cooperative. Normal mood and affect  ASSESSMENT AND PLAN: *Atypical chest pain: I am not sure that this is reflux related, may be more musculoskeletal, but she does seem to have a mild increase in her reflux symptoms recently as well.  Currently only using Pepcid at nighttime as needed, but sounds like she is needing it quite frequently.  Has never had EGD in the past, but has discussed doing one with Dr. Fuller Plan previously.  I think that we could go ahead and proceed for evaluation.  I have asked her to  begin taking her Pepcid regularly twice daily, 20 mg in the morning and 20 mg at bedtime.  Says that previously omeprazole caused her abdominal pain or just did not do well for her so we will hold off on starting another PPI for now pending results of endoscopy. She has also had some mild nausea.  Prescriptions for Pepcid twice daily as well as Zofran as needed for nausea were sent to her pharmacy. *Personal history of colon polyps:  Due for colonoscopy in April.  Will plan for colonoscopy with Dr. Fuller Plan.  **The risks, benefits, and alternatives to EGD and colonoscopy were discussed with the patient and she consents to proceed.    CC:  Erica Kramer,  Nickola Major, DO

## 2019-04-10 ENCOUNTER — Encounter: Payer: Self-pay | Admitting: Gastroenterology

## 2019-04-10 DIAGNOSIS — Z86010 Personal history of colonic polyps: Secondary | ICD-10-CM | POA: Insufficient documentation

## 2019-04-10 DIAGNOSIS — R0789 Other chest pain: Secondary | ICD-10-CM | POA: Insufficient documentation

## 2019-04-10 NOTE — Progress Notes (Signed)
Reviewed and agree with management plan.  Haylynn Pha T. Davisha Linthicum, MD FACG [facility]  

## 2019-04-22 ENCOUNTER — Encounter: Payer: Self-pay | Admitting: Gastroenterology

## 2019-04-22 ENCOUNTER — Other Ambulatory Visit: Payer: Self-pay

## 2019-04-22 ENCOUNTER — Ambulatory Visit (AMBULATORY_SURGERY_CENTER): Payer: Medicare Other | Admitting: Gastroenterology

## 2019-04-22 VITALS — BP 117/78 | HR 65 | Temp 96.9°F | Resp 12 | Ht 64.25 in | Wt 140.0 lb

## 2019-04-22 DIAGNOSIS — D123 Benign neoplasm of transverse colon: Secondary | ICD-10-CM

## 2019-04-22 DIAGNOSIS — Z860101 Personal history of adenomatous and serrated colon polyps: Secondary | ICD-10-CM

## 2019-04-22 DIAGNOSIS — D124 Benign neoplasm of descending colon: Secondary | ICD-10-CM

## 2019-04-22 DIAGNOSIS — K219 Gastro-esophageal reflux disease without esophagitis: Secondary | ICD-10-CM

## 2019-04-22 DIAGNOSIS — K2100 Gastro-esophageal reflux disease with esophagitis, without bleeding: Secondary | ICD-10-CM

## 2019-04-22 DIAGNOSIS — D125 Benign neoplasm of sigmoid colon: Secondary | ICD-10-CM

## 2019-04-22 DIAGNOSIS — Z86010 Personal history of colonic polyps: Secondary | ICD-10-CM

## 2019-04-22 DIAGNOSIS — K317 Polyp of stomach and duodenum: Secondary | ICD-10-CM

## 2019-04-22 DIAGNOSIS — R0789 Other chest pain: Secondary | ICD-10-CM

## 2019-04-22 HISTORY — PX: UPPER GASTROINTESTINAL ENDOSCOPY: SHX188

## 2019-04-22 HISTORY — PX: COLONOSCOPY: SHX174

## 2019-04-22 MED ORDER — PANTOPRAZOLE SODIUM 40 MG PO TBEC: 40.0000 mg | DELAYED_RELEASE_TABLET | Freq: Every day | ORAL | 11 refills | Status: DC

## 2019-04-22 MED ORDER — SODIUM CHLORIDE 0.9 % IV SOLN: 500.0000 mL | Freq: Once | INTRAVENOUS | Status: DC

## 2019-04-22 NOTE — Op Note (Signed)
[facility] Patient Name: Erica Kramer Procedure Date: 04/22/2019 2:25 PM MRN: [id number] Endoscopist: Ladene Artist , MD Age: 64 Referring MD:  Date of Birth: 02-22-1955 Gender: Female Account #: [id number] Procedure:                Upper GI endoscopy Indications:              Gastroesophageal reflux disease, Unexplained chest                            pain Medicines:                Monitored Anesthesia Care Procedure:                Pre-Anesthesia Assessment:                           - Prior to the procedure, a History and Physical                            was performed, and patient medications and                            allergies were reviewed. The patient's tolerance of                            previous anesthesia was also reviewed. The risks                            and benefits of the procedure and the sedation                            options and risks were discussed with the patient.                            All questions were answered, and informed consent                            was obtained. Prior Anticoagulants: The patient has                            taken no previous anticoagulant or antiplatelet                            agents. ASA Grade Assessment: II - A patient with                            mild systemic disease. After reviewing the risks                            and benefits, the patient was deemed in                            satisfactory condition to undergo the procedure.  After obtaining informed consent, the endoscope was                            passed under direct vision. Throughout the                            procedure, the patient's blood pressure, pulse, and                            oxygen saturations were monitored continuously. The                            Endoscope was introduced through the mouth, and                            advanced to the second part of duodenum. The upper                             GI endoscopy was accomplished without difficulty.                            The patient tolerated the procedure well. Scope In: Scope Out: Findings:                 LA Grade A (one or more mucosal breaks less than 5                            mm, not extending between tops of 2 mucosal folds)                            esophagitis with no bleeding was found in the                            distal esophagus.                           The Z-line was variable and was found 35 cm from                            the incisors. Biopsies were taken with a cold                            forceps for histology.                           A single area of ectopic gastric mucosa was found                            in the proximal esophagus, 24 cm from the incisors.                           The exam of the esophagus was otherwise normal.  A few 3 to 5 mm sessile polyps with no bleeding and                            no stigmata of recent bleeding were found in the                            gastric fundus and in the gastric body. Biopsies                            were taken with a cold forceps for histology.                           The exam of the stomach was otherwise normal.                           The duodenal bulb and second portion of the                            duodenum were normal. Complications:            No immediate complications. Estimated Blood Loss:     Estimated blood loss was minimal. Impression:               - LA Grade A reflux esophagitis with no bleeding.                           - Ectopic gastric mucosa, proximal esophagus.                           - Z-line variable, 35 cm from the incisors.                            Biopsied.                           - A few gastric polyps. Biopsied.                           - Normal duodenal bulb and second portion of the                            duodenum. Recommendation:            - Patient has a contact number available for                            emergencies. The signs and symptoms of potential                            delayed complications were discussed with the                            patient. Return to normal activities tomorrow.  Written discharge instructions were provided to the                            patient.                           - Resume previous diet.                           - Antireflux measures long term.                           - Continue present medications.                           - Await pathology results.                           - Protonix (pantoprazole) 40 mg PO daily, 1 year of                            refills.                           - Return to GI office in 6 weeks. Ladene Artist, MD 04/22/2019 3:22:03 PM This report has been signed electronically.

## 2019-04-22 NOTE — Progress Notes (Signed)
pt tolerated well. VSS. awake and to recovery. Report given to RN. Oral bite block placed and removed with ease. Atraumatic.

## 2019-04-22 NOTE — Op Note (Signed)
[facility] [facility] Patient Name: Erica Kramer Procedure Date: 04/22/2019 2:25 PM MRN: [id number] Endoscopist: Ladene Artist , MD Age: 64 Referring MD:  Date of Birth: September 15, 1955 Gender: Female Account #: [id number] Procedure:                Colonoscopy Indications:              Surveillance: Personal history of adenomatous                            polyps on last colonoscopy 3 years ago Medicines:                Monitored Anesthesia Care Procedure:                Pre-Anesthesia Assessment:                           - Prior to the procedure, a History and Physical                            was performed, and patient medications and                            allergies were reviewed. The patient's tolerance of                            previous anesthesia was also reviewed. The risks                            and benefits of the procedure and the sedation                            options and risks were discussed with the patient.                            All questions were answered, and informed consent                            was obtained. Prior Anticoagulants: The patient has                            taken no previous anticoagulant or antiplatelet                            agents. ASA Grade Assessment: II - A patient with                            mild systemic disease. After reviewing the risks                            and benefits, the patient was deemed in                            satisfactory condition to undergo the procedure.  After obtaining informed consent, the colonoscope                            was passed under direct vision. Throughout the                            procedure, the patient's blood pressure, pulse, and                            oxygen saturations were monitored continuously. The                            Colonoscope was introduced through the anus and                            advanced to the the cecum,  identified by                            appendiceal orifice and ileocecal valve. The                            ileocecal valve, appendiceal orifice, and rectum                            were photographed. The quality of the bowel                            preparation was good after extensive lavage and                            suctioning. The colonoscopy was performed without                            difficulty. The patient tolerated the procedure                            well. Scope In: 2:39:47 PM Scope Out: 2:59:17 PM Scope Withdrawal Time: 0 hours 14 minutes 42 seconds  Total Procedure Duration: 0 hours 19 minutes 30 seconds  Findings:                 The perianal and digital rectal examinations were                            normal.                           Five sessile polyps were found in the sigmoid colon                            (1), descending colon (1) and transverse colon (3).                            The polyps were 7 to 9 mm in size. These polyps  were removed with a cold snare. Resection and                            retrieval were complete.                           Multiple small-mouthed diverticula were found in                            the left colon. There was no evidence of                            diverticular bleeding.                           Internal hemorrhoids were found during                            retroflexion. The hemorrhoids were small and Grade                            I (internal hemorrhoids that do not prolapse).                           The exam was otherwise without abnormality on                            direct and retroflexion views. Complications:            No immediate complications. Estimated blood loss:                            None. Estimated Blood Loss:     Estimated blood loss: none. Impression:               - Five 7 to 9 mm polyps in the sigmoid colon, in                             the descending colon and in the transverse colon,                            removed with a cold snare. Resected and retrieved.                           - Mild diverticulosis in the left colon.                           - Internal hemorrhoids.                           - The examination was otherwise normal on direct                            and retroflexion views. Recommendation:           - Repeat colonoscopy, likely in 3 years, for  surveillance after pathology reviewed with a more                            extensive bowel prep.                           - Patient has a contact number available for                            emergencies. The signs and symptoms of potential                            delayed complications were discussed with the                            patient. Return to normal activities tomorrow.                            Written discharge instructions were provided to the                            patient.                           - High fiber diet.                           - Continue present medications.                           - Await pathology results. Ladene Artist, MD 04/22/2019 3:03:01 PM This report has been signed electronically.

## 2019-04-22 NOTE — Progress Notes (Signed)
Pt's states no medical or surgical changes since previsit or office visit.  Temp MH  Vitals DT

## 2019-04-22 NOTE — Patient Instructions (Addendum)
YOU HAD AN ENDOSCOPIC PROCEDURE TODAY AT [facility]:   Refer to the procedure report that was given to you for any specific questions about what was found during the examination.  If the procedure report does not answer your questions, please call your gastroenterologist to clarify.  If you requested that your care partner not be given the details of your procedure findings, then the procedure report has been included in a sealed envelope for you to review at your convenience later.  YOU SHOULD EXPECT: Some feelings of bloating in the abdomen. Passage of more gas than usual.  Walking can help get rid of the air that was put into your GI tract during the procedure and reduce the bloating. If you had a lower endoscopy (such as a colonoscopy or flexible sigmoidoscopy) you may notice spotting of blood in your stool or on the toilet paper. If you underwent a bowel prep for your procedure, you may not have a normal bowel movement for a few days.  Please Note:  You might notice some irritation and congestion in your nose or some drainage.  This is from the oxygen used during your procedure.  There is no need for concern and it should clear up in a day or so.  SYMPTOMS TO REPORT IMMEDIATELY:   Following lower endoscopy (colonoscopy or flexible sigmoidoscopy):  Excessive amounts of blood in the stool  Significant tenderness or worsening of abdominal pains  Swelling of the abdomen that is new, acute  Fever of 100F or higher   Following upper endoscopy (EGD)  Vomiting of blood or coffee ground material  New chest pain or pain under the shoulder blades  Painful or persistently difficult swallowing  New shortness of breath  Fever of 100F or higher  Black, tarry-looking stools  For urgent or emergent issues, a gastroenterologist can be reached at any hour by calling [phone]. Do not use MyChart messaging for urgent concerns.    DIET:  We do recommend a small meal at first, but  then you may proceed to your regular diet.  Drink plenty of fluids but you should avoid alcoholic beverages for 24 hours.  ACTIVITY:  You should plan to take it easy for the rest of today and you should NOT DRIVE or use heavy machinery until tomorrow (because of the sedation medicines used during the test).    FOLLOW UP: Our staff will call the number listed on your records 48-72 hours following your procedure to check on you and address any questions or concerns that you may have regarding the information given to you following your procedure. If we do not reach you, we will leave a message.  We will attempt to reach you two times.  During this call, we will ask if you have developed any symptoms of COVID 19. If you develop any symptoms (ie: fever, flu-like symptoms, shortness of breath, cough etc.) before then, please call [phone].  If you test positive for Covid 19 in the 2 weeks post procedure, please call and report this information to [place].    If any biopsies were taken you will be contacted by phone or by letter within the next 1-3 weeks.  Please call us at [phone] if you have not heard about the biopsies in 3 weeks.    SIGNATURES/CONFIDENTIALITY: You and/or your care partner have signed paperwork which will be entered into your electronic medical record.  These signatures attest to the fact that that the information above on  your After Visit Summary has been reviewed and is understood.  Full responsibility of the confidentiality of this discharge information lies with you and/or your care-partner.   Resume medications. Information given on polyps,diverticulosis,hemorrhoids,high fiber diet,Esophagitis. Follow up in office with Dr. Fuller Plan in 6 weeks.

## 2019-04-22 NOTE — Progress Notes (Signed)
Called to room to assist during endoscopic procedure.  Patient ID and intended procedure confirmed with present staff. Received instructions for my participation in the procedure from the performing physician.  

## 2019-04-27 ENCOUNTER — Telehealth: Payer: Self-pay

## 2019-04-27 ENCOUNTER — Telehealth: Payer: Self-pay | Admitting: Gastroenterology

## 2019-04-27 NOTE — Telephone Encounter (Signed)
LVM

## 2019-04-27 NOTE — Telephone Encounter (Signed)
Contacted the patient she stated that the day after her EGD/Colonoscopy she felt fine and decided to wait to start her Pantoprazole the next day. She stated that she took the Pantoprazole at 8 am, and she began to have nausea around 30-45 minutes after. She did take it on an empty stomach and ate 30 minutes later. She also had abdominal pain, salivating, and burning just below the sternum. She stated that th feelings did not go away until midnight. She did take her famotidine the next day and felt better, but she has noticed that her GERD has been worse since the EGD than it was before. She has also been trying to do a bland diet. She did do Omeprazole OTC around a year and had abdominal pain after taking it. She is concerned that she had these symptoms from the Pantoprazole, but also wonders if it from trying to restart food? Erica Kramer is wondering if she should try to take the Pantoprazole again?

## 2019-04-27 NOTE — Telephone Encounter (Signed)
Second follow up phone call attempt, no answer, unable to LM

## 2019-04-28 NOTE — Telephone Encounter (Signed)
Please discontinue pantoprazole.  Begin famotidine 40 mg po bid, 1 year of refills

## 2019-04-29 MED ORDER — FAMOTIDINE 40 MG PO TABS: 40.0000 mg | ORAL_TABLET | Freq: Two times a day (BID) | ORAL | 11 refills | Status: DC

## 2019-04-29 NOTE — Telephone Encounter (Signed)
Discontinued pantoprazole from patient's chart and sent Famotidine to the pharmacy. Patient has been notified and agrees to try taking the Famotidine at 40 mg twice daily. She will contact the office if she has any further issues.

## 2019-05-04 ENCOUNTER — Encounter: Payer: Self-pay | Admitting: Gastroenterology

## 2019-05-06 ENCOUNTER — Ambulatory Visit: Payer: Medicare Other | Admitting: Gastroenterology

## 2019-06-02 ENCOUNTER — Encounter: Payer: Self-pay | Admitting: Gastroenterology

## 2019-06-02 ENCOUNTER — Ambulatory Visit: Payer: Medicare Other | Admitting: Gastroenterology

## 2019-06-02 VITALS — BP 110/70 | HR 80 | Temp 97.6°F | Ht 64.25 in | Wt 134.4 lb

## 2019-06-02 DIAGNOSIS — Z86010 Personal history of colonic polyps: Secondary | ICD-10-CM | POA: Diagnosis not present

## 2019-06-02 DIAGNOSIS — K2100 Gastro-esophageal reflux disease with esophagitis, without bleeding: Secondary | ICD-10-CM

## 2019-06-02 NOTE — Progress Notes (Signed)
    History of Present Illness: This is a 64 year old female with GERD with LA Class A esophagitis and chest pain. She had nausea and abdominal pain with pantoprazole so it was stopped. Famotidine 40 mg po bid with food is the best way for her tolerate this medication. Chest pain and reflux symptoms are 90% improved.   Current Medications, Allergies, Past Medical History, Past Surgical History, Family History and Social History were reviewed in [employer] record.   Physical Exam: General: Well developed, well nourished, no acute distress Head: Normocephalic and atraumatic Eyes:  sclerae anicteric, EOMI Ears: Normal auditory acuity Mouth: Not examined, mask on during Covid-19 pandemic Lungs: Clear throughout to auscultation Heart: Regular rate and rhythm; no murmurs, rubs or bruits Abdomen: Soft, non tender and non distended. No masses, hepatosplenomegaly or hernias noted. Normal Bowel sounds Rectal: Not done   Musculoskeletal: Symmetrical with no gross deformities  Pulses:  Normal pulses noted Extremities: No clubbing, cyanosis, edema or deformities noted Neurological: Alert oriented x 4, grossly nonfocal Psychological:  Alert and cooperative. Normal mood and affect   Assessment and Recommendations:  1.  GERD with LA Class A esophagitis. Continue famotidine 40 mg po bid with food. Follow antireflux measures. REV in 1 year.   2. Personal history of adenomatous colon polyps. Surveillance colonoscopy recommended in April 2024.

## 2019-06-02 NOTE — Patient Instructions (Signed)
Thank you for choosing me and [facility].  Malcolm T. Stark, Jr., MD., FACG  

## 2019-06-08 ENCOUNTER — Other Ambulatory Visit: Payer: Self-pay | Admitting: Family Medicine

## 2019-06-08 ENCOUNTER — Telehealth: Payer: Self-pay | Admitting: Family Medicine

## 2019-06-08 MED ORDER — VALACYCLOVIR HCL 500 MG PO TABS: 500.0000 mg | ORAL_TABLET | Freq: Every day | ORAL | 3 refills | Status: DC

## 2019-06-08 NOTE — Telephone Encounter (Signed)
Med refilled.

## 2019-06-08 NOTE — Telephone Encounter (Signed)
Pt is calling in stating that her medication has expired on 03/2019 valacyclovir ([place])   Pharm:  [facility]  [phone].

## 2019-06-08 NOTE — Telephone Encounter (Signed)
Noted  

## 2019-06-26 ENCOUNTER — Telehealth: Payer: Self-pay | Admitting: Family Medicine

## 2019-06-26 NOTE — Telephone Encounter (Signed)
No answer at the pts home number. 

## 2019-06-26 NOTE — Telephone Encounter (Signed)
Pt would like to have a tetanus shot is she eligible for one?

## 2019-06-29 NOTE — Telephone Encounter (Signed)
Patient called back and was informed due to her age and insurance of Medicare, she would need to get a tetanus shot from the pharmacy.

## 2019-06-29 NOTE — Telephone Encounter (Signed)
No answer at pts home number-left a message at the pts cell number to return my call.

## 2019-07-10 ENCOUNTER — Other Ambulatory Visit: Payer: Self-pay | Admitting: Family Medicine

## 2019-07-10 DIAGNOSIS — Z1231 Encounter for screening mammogram for malignant neoplasm of breast: Secondary | ICD-10-CM

## 2019-07-13 DIAGNOSIS — Z0120 Encounter for dental examination and cleaning without abnormal findings: Secondary | ICD-10-CM | POA: Diagnosis not present

## 2019-07-16 DIAGNOSIS — H2513 Age-related nuclear cataract, bilateral: Secondary | ICD-10-CM | POA: Diagnosis not present

## 2019-07-16 DIAGNOSIS — H18513 Endothelial corneal dystrophy, bilateral: Secondary | ICD-10-CM | POA: Diagnosis not present

## 2019-07-16 DIAGNOSIS — H04123 Dry eye syndrome of bilateral lacrimal glands: Secondary | ICD-10-CM | POA: Diagnosis not present

## 2019-07-16 DIAGNOSIS — H40013 Open angle with borderline findings, low risk, bilateral: Secondary | ICD-10-CM | POA: Diagnosis not present

## 2019-07-29 ENCOUNTER — Other Ambulatory Visit: Payer: Self-pay

## 2019-07-29 ENCOUNTER — Ambulatory Visit
Admission: RE | Admit: 2019-07-29 | Discharge: 2019-07-29 | Disposition: A | Discharge status: Home / Self Care | Payer: Medicare Other | Source: Ambulatory Visit | Attending: Family Medicine | Admitting: Family Medicine

## 2019-07-29 DIAGNOSIS — Z1231 Encounter for screening mammogram for malignant neoplasm of breast: Secondary | ICD-10-CM

## 2019-11-11 ENCOUNTER — Other Ambulatory Visit: Payer: Self-pay | Admitting: Family Medicine

## 2019-11-11 DIAGNOSIS — B351 Tinea unguium: Secondary | ICD-10-CM

## 2019-12-24 DIAGNOSIS — H18513 Endothelial corneal dystrophy, bilateral: Secondary | ICD-10-CM | POA: Diagnosis not present

## 2019-12-24 DIAGNOSIS — H04123 Dry eye syndrome of bilateral lacrimal glands: Secondary | ICD-10-CM | POA: Diagnosis not present

## 2019-12-24 DIAGNOSIS — H40013 Open angle with borderline findings, low risk, bilateral: Secondary | ICD-10-CM | POA: Diagnosis not present

## 2019-12-24 DIAGNOSIS — H2513 Age-related nuclear cataract, bilateral: Secondary | ICD-10-CM | POA: Diagnosis not present

## 2020-04-20 ENCOUNTER — Telehealth: Payer: Self-pay | Admitting: Family Medicine

## 2020-04-20 NOTE — Telephone Encounter (Signed)
Tried calling patient to  schedule Medicare Annual Wellness Visit (AWV) either virtually or in office.  Phone kept disconnect  AWV-I PER PALMETTO 08/23/19 please schedule at anytime with [facility] Nurse Health Advisor 1 or 2   This should be a 45 minute visit.

## 2020-05-24 ENCOUNTER — Ambulatory Visit: Payer: Medicare Other

## 2020-06-27 DIAGNOSIS — N898 Other specified noninflammatory disorders of vagina: Secondary | ICD-10-CM | POA: Diagnosis not present

## 2020-06-27 DIAGNOSIS — B373 Candidiasis of vulva and vagina: Secondary | ICD-10-CM | POA: Diagnosis not present

## 2020-07-04 DIAGNOSIS — D225 Melanocytic nevi of trunk: Secondary | ICD-10-CM | POA: Diagnosis not present

## 2020-07-04 DIAGNOSIS — L821 Other seborrheic keratosis: Secondary | ICD-10-CM | POA: Diagnosis not present

## 2020-07-04 DIAGNOSIS — L814 Other melanin hyperpigmentation: Secondary | ICD-10-CM | POA: Diagnosis not present

## 2020-07-04 DIAGNOSIS — L728 Other follicular cysts of the skin and subcutaneous tissue: Secondary | ICD-10-CM | POA: Diagnosis not present

## 2020-07-07 DIAGNOSIS — H40013 Open angle with borderline findings, low risk, bilateral: Secondary | ICD-10-CM | POA: Diagnosis not present

## 2020-07-07 DIAGNOSIS — H0288A Meibomian gland dysfunction right eye, upper and lower eyelids: Secondary | ICD-10-CM | POA: Diagnosis not present

## 2020-07-07 DIAGNOSIS — H18513 Endothelial corneal dystrophy, bilateral: Secondary | ICD-10-CM | POA: Diagnosis not present

## 2020-07-07 DIAGNOSIS — H04123 Dry eye syndrome of bilateral lacrimal glands: Secondary | ICD-10-CM | POA: Diagnosis not present

## 2020-07-08 ENCOUNTER — Telehealth: Payer: Self-pay | Admitting: Family Medicine

## 2020-07-08 NOTE — Telephone Encounter (Signed)
Left message for patient to call back and schedule Medicare Annual Wellness Visit (AWV) either virtually or in office.    AWV-I PER PALMETTO 08/23/19  please schedule at anytime with [facility] Nurse Health Advisor 1 or 2   This should be a 45 minute visit.

## 2020-07-08 NOTE — Telephone Encounter (Signed)
Patient called to return Robin's call for scheduling AWV. Patient says that she has to wait on work schedule in order to schedule.  Told patient to call back when she has schedule and we can schedule her.

## 2020-07-12 ENCOUNTER — Other Ambulatory Visit: Payer: Self-pay | Admitting: Family Medicine

## 2020-07-12 DIAGNOSIS — Z1231 Encounter for screening mammogram for malignant neoplasm of breast: Secondary | ICD-10-CM

## 2020-07-29 DIAGNOSIS — L570 Actinic keratosis: Secondary | ICD-10-CM | POA: Diagnosis not present

## 2020-07-29 DIAGNOSIS — L718 Other rosacea: Secondary | ICD-10-CM | POA: Diagnosis not present

## 2020-07-29 DIAGNOSIS — L720 Epidermal cyst: Secondary | ICD-10-CM | POA: Diagnosis not present

## 2020-07-29 DIAGNOSIS — L738 Other specified follicular disorders: Secondary | ICD-10-CM | POA: Diagnosis not present

## 2020-08-08 ENCOUNTER — Telehealth: Payer: Self-pay | Admitting: Family Medicine

## 2020-08-08 NOTE — Telephone Encounter (Signed)
Left message for patient to call back and schedule Medicare Annual Wellness Visit (AWV) either virtually or in office.    AWV-I PER PALMETTO 08/23/19   please schedule at anytime with [facility] Nurse Health Advisor 1 or 2   This should be a 45 minute visit.

## 2020-08-08 NOTE — Telephone Encounter (Signed)
Pt is calling in stating that at this present time that she has a lot of other appointments and would like to wait until she has her CPE with Dr. Ethlyn Gallery

## 2020-08-09 NOTE — Telephone Encounter (Signed)
Documented on spreadsheet  I will call back in Oct

## 2020-09-05 ENCOUNTER — Ambulatory Visit
Admission: RE | Admit: 2020-09-05 | Discharge: 2020-09-05 | Disposition: A | Discharge status: Home / Self Care | Payer: Medicare Other | Source: Ambulatory Visit | Attending: Family Medicine | Admitting: Family Medicine

## 2020-09-05 ENCOUNTER — Other Ambulatory Visit: Payer: Self-pay

## 2020-09-05 DIAGNOSIS — Z1231 Encounter for screening mammogram for malignant neoplasm of breast: Secondary | ICD-10-CM

## 2020-09-16 ENCOUNTER — Other Ambulatory Visit: Payer: Self-pay

## 2020-09-16 ENCOUNTER — Encounter: Payer: Self-pay | Admitting: Family Medicine

## 2020-09-16 ENCOUNTER — Other Ambulatory Visit: Payer: Self-pay | Admitting: Family Medicine

## 2020-09-16 ENCOUNTER — Ambulatory Visit (INDEPENDENT_AMBULATORY_CARE_PROVIDER_SITE_OTHER): Payer: Medicare Other | Admitting: Family Medicine

## 2020-09-16 VITALS — BP 110/68 | HR 70 | Temp 97.7°F | Ht 64.75 in | Wt 139.4 lb

## 2020-09-16 DIAGNOSIS — R7301 Impaired fasting glucose: Secondary | ICD-10-CM | POA: Diagnosis not present

## 2020-09-16 DIAGNOSIS — Z0000 Encounter for general adult medical examination without abnormal findings: Secondary | ICD-10-CM | POA: Diagnosis not present

## 2020-09-16 DIAGNOSIS — R635 Abnormal weight gain: Secondary | ICD-10-CM

## 2020-09-16 DIAGNOSIS — B351 Tinea unguium: Secondary | ICD-10-CM

## 2020-09-16 DIAGNOSIS — K2100 Gastro-esophageal reflux disease with esophagitis, without bleeding: Secondary | ICD-10-CM

## 2020-09-16 DIAGNOSIS — Z13220 Encounter for screening for lipoid disorders: Secondary | ICD-10-CM

## 2020-09-16 MED ORDER — TIZANIDINE HCL 2 MG PO TABS: 2.0000 mg | ORAL_TABLET | Freq: Every day | ORAL | 0 refills | Status: DC

## 2020-09-16 MED ORDER — FAMOTIDINE 40 MG PO TABS: 40.0000 mg | ORAL_TABLET | Freq: Two times a day (BID) | ORAL | 2 refills | Status: DC

## 2020-09-16 MED ORDER — CICLOPIROX 8 % EX SOLN: CUTANEOUS | 5 refills | Status: DC

## 2020-09-16 NOTE — Progress Notes (Signed)
Erica Kramer DOB: Jul 15, 1955 Encounter date: 09/16/2020  This is a 65 y.o. female who presents for complete physical   History of present illness/Additional concerns:  She does state chronic issue with ears - uses claritin and flonase - but just when needed. She checked with ophthalmologist about using the flonase a little longer/more regular. Just to call them if she was going to use over 2 months.   GERD: had followed with Dr. Fuller Plan for reflux/esophagitis - pepcid '40mg'$  bid - this has worked well for her. Just once a day helps with this.   Feels like she is eating less, gaining more. Feels like this has been a change since turning 58. Moving all the time; up and down stairs.   repeat colonoscopy 03/2022 (multiple polyps 2021)  Sees beth lane at [facility] - does pap and mammogram through her. Last mammogram 09/05/20 normal. DEXA osteopenia from 06/2018.   Sees dr. Allyson Sabal derm - has kept up to date with this.   Past Medical History:  Diagnosis Date   Allergy    mild   Anemia    as child only   Anxiety    no medicines   Chronic frontal sinusitis 01/28/2007   Qualifier: Diagnosis of  By: Arnoldo Morale MD, John E    COLONIC POLYPS, ADENOMATOUS, HX OF 08/30/2008   Qualifier: Diagnosis of  By: Harlon Ditty CMA (AAMA), Dottie     Constipation    diet controlled mainly   Diverticulosis, acute    DJD (degenerative joint disease)    GERD 09/02/2008   Qualifier: Diagnosis of  By: Fuller Plan MD Lamont Snowball T    Glaucoma    Headache(784.0)    HEMATOCHEZIA 09/02/2008   Qualifier: Diagnosis of  By: Fuller Plan MD Lamont Snowball T    Macular degeneration syndrome    MONILIASIS, SKIN/NAILS 01/28/2007   Qualifier: Diagnosis of  By: Arnoldo Morale MD, John E    Neck pain, chronic    Osteopenia    PONV (postoperative nausea and vomiting)    Rosacea    TMJ (temporomandibular joint disorder)    Past Surgical History:  Procedure Laterality Date   ABDOMINAL HYSTERECTOMY     COLONOSCOPY  04/22/2019   LAPAROSCOPIC  APPENDECTOMY  03/12/2011   Procedure: APPENDECTOMY LAPAROSCOPIC;  Surgeon: Merrie Roof, MD;  Location: [facility];  Service: General;  Laterality: N/A;   POLYPECTOMY     TONSILLECTOMY     UPPER GASTROINTESTINAL ENDOSCOPY  04/22/2019   No Known Allergies Current Meds  Medication Sig   calcium-vitamin D (OSCAL WITH D) 500-200 MG-UNIT per tablet Take 1 tablet by mouth daily. Takes on and off   ciclopirox (PENLAC) 8 % solution USE ON NAIL & SURROUNDING SKIN AT BED. USE DAILY OVER PREVIOUS COAT X7DAYS, REMOVE W/ALCOHOL RESTART   ergocalciferol (VITAMIN D2) 50000 UNITS capsule Take 50,000 Units by mouth once a week.   estradiol (ESTRACE) 0.1 MG/GM vaginal cream Place 1 Applicatorful vaginally as needed.   fluticasone (FLONASE) 50 MCG/ACT nasal spray Place into both nostrils daily.   latanoprost (XALATAN) 0.005 % ophthalmic solution Place 1 drop into both eyes at bedtime.     valACYclovir (VALTREX) 500 MG tablet Take 1 tablet (500 mg total) by mouth daily.   [DISCONTINUED] famotidine (PEPCID) 40 MG tablet Take 1 tablet (40 mg total) by mouth 2 (two) times daily.   Social History   Tobacco Use   Smoking status: Never   Smokeless tobacco: Never  Substance Use Topics   Alcohol use:  Yes    Comment: occasional wine-2 to 3 glasses per year   Family History  Problem Relation Age of Onset   Breast cancer Mother    Colon polyps Father    Barrett's esophagus Father    Heart disease Other    Cancer Other    Colon cancer Other    Diabetes Other    Breast cancer Maternal Aunt    Rectal cancer Neg Hx    Stomach cancer Neg Hx    Esophageal cancer Neg Hx      Review of Systems  Constitutional:  Negative for activity change, appetite change, chills, fatigue, fever and unexpected weight change.  HENT:  Negative for congestion, ear pain, hearing loss, sinus pressure, sinus pain, sore throat and trouble swallowing.   Eyes:  Negative for pain and visual disturbance.  Respiratory:  Negative for  cough, chest tightness, shortness of breath and wheezing.   Cardiovascular:  Negative for chest pain, palpitations and leg swelling.  Gastrointestinal:  Negative for abdominal pain, blood in stool, constipation, diarrhea, nausea and vomiting.  Genitourinary:  Negative for difficulty urinating and menstrual problem.  Musculoskeletal:  Negative for arthralgias and back pain.  Skin:  Negative for rash.  Neurological:  Negative for dizziness, weakness, numbness and headaches.  Hematological:  Negative for adenopathy. Does not bruise/bleed easily.  Psychiatric/Behavioral:  Negative for sleep disturbance and suicidal ideas. The patient is not nervous/anxious.    CBC:  Lab Results  Component Value Date   WBC 4.9 03/03/2015   HGB 14.4 03/03/2015   HCT 43.0 03/03/2015   MCHC 33.4 03/03/2015   RDW 13.2 03/03/2015   PLT 209.0 03/03/2015   CMP: Lab Results  Component Value Date   NA 140 10/29/2018   K 4.7 10/29/2018   CL 103 10/29/2018   CO2 31 10/29/2018   GLUCOSE 79 10/29/2018   BUN 12 10/29/2018   CREATININE 0.80 10/29/2018   GFRAA 113 01/06/2007   CALCIUM 9.5 10/29/2018   PROT 6.8 10/29/2018   BILITOT 0.6 10/29/2018   ALKPHOS 77 10/29/2018   ALT 15 10/29/2018   AST 16 10/29/2018   LIPID: Lab Results  Component Value Date   CHOL 204 (H) 10/29/2018   TRIG 102.0 10/29/2018   HDL 72.80 10/29/2018   LDLCALC 111 (H) 10/29/2018    Objective:  BP 110/68 (BP Location: Left Arm, Patient Position: Sitting, Cuff Size: Normal)   Pulse 70   Temp 97.7 F (36.5 C) (Oral)   Ht 5' 4.75" (1.645 m)   Wt 139 lb 6.4 oz (63.2 kg)   SpO2 98%   BMI 23.38 kg/m   Weight: 139 lb 6.4 oz (63.2 kg)   BP Readings from Last 3 Encounters:  09/16/20 110/68  06/02/19 110/70  04/22/19 117/78   Wt Readings from Last 3 Encounters:  09/16/20 139 lb 6.4 oz (63.2 kg)  06/02/19 134 lb 6.4 oz (61 kg)  04/22/19 140 lb (63.5 kg)    Physical Exam Constitutional:      General: She is not in acute  distress.    Appearance: She is well-developed.  HENT:     Head: Normocephalic and atraumatic.     Right Ear: External ear normal.     Left Ear: External ear normal.     Mouth/Throat:     Pharynx: No oropharyngeal exudate.  Eyes:     Conjunctiva/sclera: Conjunctivae normal.     Pupils: Pupils are equal, round, and reactive to light.  Neck:     Thyroid:  No thyromegaly.  Cardiovascular:     Rate and Rhythm: Normal rate and regular rhythm.     Heart sounds: Normal heart sounds. No murmur heard.   No friction rub. No gallop.  Pulmonary:     Effort: Pulmonary effort is normal.     Breath sounds: Normal breath sounds.  Abdominal:     General: Bowel sounds are normal. There is no distension.     Palpations: Abdomen is soft. There is no mass.     Tenderness: There is no abdominal tenderness. There is no guarding.     Hernia: No hernia is present.  Musculoskeletal:        General: No tenderness or deformity. Normal range of motion.     Cervical back: Normal range of motion and neck supple.  Lymphadenopathy:     Cervical: No cervical adenopathy.  Skin:    General: Skin is warm and dry.     Findings: No rash.  Neurological:     Mental Status: She is alert and oriented to person, place, and time.     Deep Tendon Reflexes: Reflexes normal.     Reflex Scores:      Tricep reflexes are 2+ on the right side and 2+ on the left side.      Bicep reflexes are 2+ on the right side and 2+ on the left side.      Brachioradialis reflexes are 2+ on the right side and 2+ on the left side.      Patellar reflexes are 2+ on the right side and 2+ on the left side. Psychiatric:        Speech: Speech normal.        Behavior: Behavior normal.        Thought Content: Thought content normal.    Assessment/Plan: Health Maintenance Due  Topic Date Due   Zoster Vaccines- Shingrix (2 of 2) 01/09/2018   TETANUS/TDAP  12/01/2018   PNA vac Low Risk Adult (2 of 2 - PCV13) 10/29/2019   INFLUENZA VACCINE   08/22/2020   Health Maintenance reviewed - she will get shingrix at pharmacy at convenience.  1. Preventative health care Work on regular exercise; less starchy eating.   2. Gastroesophageal reflux disease with esophagitis without hemorrhage She will set up follow up with GI.  - Comprehensive metabolic panel; Future  3. Impaired fasting glucose Discussed diabetic type diet, low startch diet. - Hemoglobin A1c; Future  4. Weight gain Work in [employer], more regular exercise.  - CBC with Differential/Platelet; Future - TSH; Future  5. Lipid screening - Lipid panel; Future   Return in about 1 year (around 09/16/2021) for physical exam.  Micheline Rough, MD

## 2020-09-19 ENCOUNTER — Other Ambulatory Visit (INDEPENDENT_AMBULATORY_CARE_PROVIDER_SITE_OTHER): Payer: Medicare Other

## 2020-09-19 ENCOUNTER — Other Ambulatory Visit: Payer: Self-pay

## 2020-09-19 DIAGNOSIS — K2100 Gastro-esophageal reflux disease with esophagitis, without bleeding: Secondary | ICD-10-CM | POA: Diagnosis not present

## 2020-09-19 DIAGNOSIS — Z6823 Body mass index (BMI) 23.0-23.9, adult: Secondary | ICD-10-CM | POA: Diagnosis not present

## 2020-09-19 DIAGNOSIS — R7301 Impaired fasting glucose: Secondary | ICD-10-CM

## 2020-09-19 DIAGNOSIS — Z13220 Encounter for screening for lipoid disorders: Secondary | ICD-10-CM

## 2020-09-19 DIAGNOSIS — R635 Abnormal weight gain: Secondary | ICD-10-CM | POA: Diagnosis not present

## 2020-09-19 DIAGNOSIS — Z01411 Encounter for gynecological examination (general) (routine) with abnormal findings: Secondary | ICD-10-CM | POA: Diagnosis not present

## 2020-09-19 DIAGNOSIS — Z01419 Encounter for gynecological examination (general) (routine) without abnormal findings: Secondary | ICD-10-CM | POA: Diagnosis not present

## 2020-09-19 DIAGNOSIS — Z124 Encounter for screening for malignant neoplasm of cervix: Secondary | ICD-10-CM | POA: Diagnosis not present

## 2020-09-19 LAB — COMPREHENSIVE METABOLIC PANEL
ALT: 19 U/L (ref 0–35)
AST: 18 U/L (ref 0–37)
Albumin: 4.1 g/dL (ref 3.5–5.2)
Alkaline Phosphatase: 70 U/L (ref 39–117)
BUN: 9 mg/dL (ref 6–23)
CO2: 29 mEq/L (ref 19–32)
Calcium: 9.2 mg/dL (ref 8.4–10.5)
Chloride: 103 mEq/L (ref 96–112)
Creatinine, Ser: 0.83 mg/dL (ref 0.40–1.20)
GFR: 73.14 mL/min (ref >60.00)
Glucose, Bld: 82 mg/dL (ref 70–99)
Potassium: 3.9 mEq/L (ref 3.5–5.1)
Sodium: 140 mEq/L (ref 135–145)
Total Bilirubin: 0.5 mg/dL (ref 0.2–1.2)
Total Protein: 6.7 g/dL (ref 6.0–8.3)

## 2020-09-19 LAB — CBC WITH DIFFERENTIAL/PLATELET
Basophils Absolute: 0 10*3/uL (ref 0.0–0.1)
Basophils Relative: 0.7 % (ref 0.0–3.0)
Eosinophils Absolute: 0.1 10*3/uL (ref 0.0–0.7)
Eosinophils Relative: 2.7 % (ref 0.0–5.0)
HCT: 41.3 % (ref 36.0–46.0)
Hemoglobin: 14 g/dL (ref 12.0–15.0)
Lymphocytes Relative: 37.1 % (ref 12.0–46.0)
Lymphs Abs: 1.5 10*3/uL (ref 0.7–4.0)
MCHC: 33.9 g/dL (ref 30.0–36.0)
MCV: 96.5 fl (ref 78.0–100.0)
Monocytes Absolute: 0.4 10*3/uL (ref 0.1–1.0)
Monocytes Relative: 11.2 % (ref 3.0–12.0)
Neutro Abs: 1.9 10*3/uL (ref 1.4–7.7)
Neutrophils Relative %: 48.3 % (ref 43.0–77.0)
Platelets: 186 10*3/uL (ref 150.0–400.0)
RBC: 4.28 Mil/uL (ref 3.87–5.11)
RDW: 12.8 % (ref 11.5–15.5)
WBC: 4 10*3/uL (ref 4.0–10.5)

## 2020-09-19 LAB — TSH: TSH: 2.75 u[IU]/mL (ref 0.35–5.50)

## 2020-09-19 LAB — LIPID PANEL
Cholesterol: 205 mg/dL — ABNORMAL HIGH (ref 0–200)
HDL: 65.3 mg/dL (ref >39.00)
LDL Cholesterol: 112 mg/dL — ABNORMAL HIGH (ref 0–99)
NonHDL: 139.6
Total CHOL/HDL Ratio: 3
Triglycerides: 139 mg/dL (ref 0.0–149.0)
VLDL: 27.8 mg/dL (ref 0.0–40.0)

## 2020-09-19 LAB — HEMOGLOBIN A1C: Hgb A1c MFr Bld: 5.7 % (ref 4.6–6.5)

## 2020-10-04 DIAGNOSIS — T180XXS Foreign body in mouth, sequela: Secondary | ICD-10-CM | POA: Diagnosis not present

## 2020-10-04 DIAGNOSIS — D3709 Neoplasm of uncertain behavior of other specified sites of the oral cavity: Secondary | ICD-10-CM | POA: Diagnosis not present

## 2020-10-04 DIAGNOSIS — K136 Irritative hyperplasia of oral mucosa: Secondary | ICD-10-CM | POA: Diagnosis not present

## 2020-10-07 ENCOUNTER — Emergency Department (HOSPITAL_BASED_OUTPATIENT_CLINIC_OR_DEPARTMENT_OTHER)
Admission: EM | Admit: 2020-10-07 | Discharge: 2020-10-07 | Disposition: A | Discharge status: Home / Self Care | Payer: Medicare Other | Attending: Emergency Medicine | Admitting: Emergency Medicine

## 2020-10-07 ENCOUNTER — Emergency Department (HOSPITAL_BASED_OUTPATIENT_CLINIC_OR_DEPARTMENT_OTHER): Payer: Medicare Other

## 2020-10-07 ENCOUNTER — Encounter (HOSPITAL_BASED_OUTPATIENT_CLINIC_OR_DEPARTMENT_OTHER): Payer: Self-pay | Admitting: Emergency Medicine

## 2020-10-07 ENCOUNTER — Other Ambulatory Visit: Payer: Self-pay

## 2020-10-07 DIAGNOSIS — R109 Unspecified abdominal pain: Secondary | ICD-10-CM | POA: Diagnosis not present

## 2020-10-07 DIAGNOSIS — K5900 Constipation, unspecified: Secondary | ICD-10-CM

## 2020-10-07 DIAGNOSIS — K8020 Calculus of gallbladder without cholecystitis without obstruction: Secondary | ICD-10-CM

## 2020-10-07 HISTORY — DX: Diverticulosis of intestine, part unspecified, without perforation or abscess without bleeding: K57.90

## 2020-10-07 LAB — CBC WITH DIFFERENTIAL/PLATELET
Abs Immature Granulocytes: 0.04 10*3/uL (ref 0.00–0.07)
Basophils Absolute: 0 10*3/uL (ref 0.0–0.1)
Basophils Relative: 0 %
Eosinophils Absolute: 0 10*3/uL (ref 0.0–0.5)
Eosinophils Relative: 0 %
HCT: 41 % (ref 36.0–46.0)
Hemoglobin: 14.1 g/dL (ref 12.0–15.0)
Immature Granulocytes: 0 %
Lymphocytes Relative: 12 %
Lymphs Abs: 1.2 10*3/uL (ref 0.7–4.0)
MCH: 32.8 pg (ref 26.0–34.0)
MCHC: 34.4 g/dL (ref 30.0–36.0)
MCV: 95.3 fL (ref 80.0–100.0)
Monocytes Absolute: 0.8 10*3/uL (ref 0.1–1.0)
Monocytes Relative: 8 %
Neutro Abs: 7.8 10*3/uL — ABNORMAL HIGH (ref 1.7–7.7)
Neutrophils Relative %: 80 %
Platelets: 190 10*3/uL (ref 150–400)
RBC: 4.3 MIL/uL (ref 3.87–5.11)
RDW: 12.5 % (ref 11.5–15.5)
WBC: 9.9 10*3/uL (ref 4.0–10.5)
nRBC: 0 % (ref 0.0–0.2)

## 2020-10-07 LAB — BASIC METABOLIC PANEL
Anion gap: 9 (ref 5–15)
BUN: 17 mg/dL (ref 8–23)
CO2: 27 mmol/L (ref 22–32)
Calcium: 9.6 mg/dL (ref 8.9–10.3)
Chloride: 103 mmol/L (ref 98–111)
Creatinine, Ser: 0.88 mg/dL (ref 0.44–1.00)
GFR, Estimated: >60 mL/min (ref >60)
Glucose, Bld: 124 mg/dL — ABNORMAL HIGH (ref 70–99)
Potassium: 3.9 mmol/L (ref 3.5–5.1)
Sodium: 139 mmol/L (ref 135–145)

## 2020-10-07 MED ORDER — POLYETHYLENE GLYCOL 3350 17 G PO PACK
17.0000 g | PACK | Freq: Once | ORAL | Status: AC
  Administered 2020-10-07: 17 g via ORAL
  Filled 2020-10-07: qty 1

## 2020-10-07 MED ORDER — IOHEXOL 350 MG/ML SOLN
100.0000 mL | Freq: Once | INTRAVENOUS | Status: AC | PRN
  Administered 2020-10-07: 75 mL via INTRAVENOUS

## 2020-10-07 NOTE — ED Provider Notes (Signed)
[facility] Provider Note: Georgena Spurling, MD, FACEP  CSN: [id number] MRN: [id number] ARRIVAL: 10/07/20 at [place]: [place]  Abdominal Pain   HISTORY OF PRESENT ILLNESS  10/07/20 1:01 AM Erica Kramer is a 65 y.o. female who had a gum biopsy 8 days ago.  Because it is difficult for her to eat and she has been following a soft diet.  Consequently she has become constipated for the past 3 days.  She has not been taking a laxative.  She is here with abdominal pain that began about 3 hours ago.  She describes the pain as crampy and initially it was about a 6 or 7 out of 10.  It was fairly poorly localized but is now most prominent in the lower abdomen, right greater than left.  She has had some nausea but no vomiting.  She has a sensation of fullness in her rectum.  Pain is somewhat worse with palpation.    Past Medical History:  Diagnosis Date   Allergy    mild   Anemia    as child only   Anxiety    no medicines   Chronic frontal sinusitis 01/28/2007   Qualifier: Diagnosis of  By: Arnoldo Morale MD, Nafisa Olds E    COLONIC POLYPS, ADENOMATOUS, HX OF 08/30/2008   Qualifier: Diagnosis of  By: Harlon Ditty CMA (AAMA), Dottie     Constipation    diet controlled mainly   Diverticulosis    DJD (degenerative joint disease)    GERD 09/02/2008   Qualifier: Diagnosis of  By: Fuller Plan MD Lamont Snowball T    Glaucoma    Headache(784.0)    HEMATOCHEZIA 09/02/2008   Qualifier: Diagnosis of  By: Fuller Plan MD Lamont Snowball T    Macular degeneration syndrome    MONILIASIS, SKIN/NAILS 01/28/2007   Qualifier: Diagnosis of  By: Arnoldo Morale MD, Eladia Frame E    Neck pain, chronic    Osteopenia    PONV (postoperative nausea and vomiting)    Rosacea    TMJ (temporomandibular joint disorder)     Past Surgical History:  Procedure Laterality Date   ABDOMINAL HYSTERECTOMY     COLONOSCOPY  04/22/2019   LAPAROSCOPIC APPENDECTOMY  03/12/2011   Procedure: APPENDECTOMY LAPAROSCOPIC;  Surgeon:  Merrie Roof, MD;  Location: [facility];  Service: General;  Laterality: N/A;   POLYPECTOMY     TONSILLECTOMY     UPPER GASTROINTESTINAL ENDOSCOPY  04/22/2019    Family History  Problem Relation Age of Onset   Breast cancer Mother    Colon polyps Father    Barrett's esophagus Father    Heart disease Other    Cancer Other    Colon cancer Other    Diabetes Other    Breast cancer Maternal Aunt    Rectal cancer Neg Hx    Stomach cancer Neg Hx    Esophageal cancer Neg Hx     Social History   Tobacco Use   Smoking status: Never   Smokeless tobacco: Never  Vaping Use   Vaping Use: Never used  Substance Use Topics   Alcohol use: Yes    Comment: occasional wine-2 to 3 glasses per year   Drug use: No    Prior to Admission medications   Medication Sig Start Date End Date Taking? Authorizing Provider  calcium-vitamin D (OSCAL WITH D) 500-200 MG-UNIT per tablet Take 1 tablet by mouth daily. Takes on and off    [provider]  ciclopirox (PENLAC) 8 %  solution Apply over nail and surrounding skin. Apply daily over previous coat. After seven (7) days, may remove with alcohol and continue cycle. 09/16/20   Caren Macadam, MD  ergocalciferol (VITAMIN D2) 50000 UNITS capsule Take 50,000 Units by mouth once a week.    [provider]  estradiol (ESTRACE) 0.1 MG/GM vaginal cream Place 1 Applicatorful vaginally as needed.    [provider]  famotidine (PEPCID) 40 MG tablet Take 1 tablet (40 mg total) by mouth 2 (two) times daily. 09/16/20   Koberlein, Steele Berg, MD  fluticasone (FLONASE) 50 MCG/ACT nasal spray Place into both nostrils daily.    [provider]  latanoprost (XALATAN) 0.005 % ophthalmic solution Place 1 drop into both eyes at bedtime.      [provider]  tiZANidine (ZANAFLEX) 2 MG tablet Take 1 tablet (2 mg total) by mouth at bedtime. 09/16/20   Caren Macadam, MD  valACYclovir (VALTREX) 500 MG tablet Take 1 tablet (500 mg  total) by mouth daily. 06/08/19   Caren Macadam, MD    Allergies Patient has no known allergies.   REVIEW OF SYSTEMS  Negative except as noted here or in the History of Present Illness.   PHYSICAL EXAMINATION  Initial Vital Signs Blood pressure (!) 170/109, pulse 97, resp. rate 18, SpO2 100 %.  Examination General: Well-developed, well-nourished female in no acute distress; appearance consistent with age of record HENT: normocephalic; atraumatic Eyes: pupils equal, round and reactive to light; extraocular muscles intact Neck: supple Heart: regular rate and rhythm Lungs: clear to auscultation bilaterally Abdomen: soft; nondistended; nontender; bowel sounds present Rectal: Normal sphincter tone; no masses palpated; no stool in vault Extremities: No deformity; full range of motion; pulses normal Neurologic: Awake, alert and oriented; motor function intact in all extremities and symmetric; no facial droop Skin: Warm and dry Psychiatric: Anxious   RESULTS  Summary of this visit's results, reviewed and interpreted by myself:   EKG Interpretation  Date/Time:    Ventricular Rate:    PR Interval:    QRS Duration:   QT Interval:    QTC Calculation:   R Axis:     Text Interpretation:         Laboratory Studies: Results for orders placed or performed during the hospital encounter of 10/07/20 (from the past 24 hour(s))  CBC with Differential/Platelet     Status: Abnormal   Collection Time: 10/07/20  1:39 AM  Result Value Ref Range   WBC 9.9 4.0 - 10.5 K/uL   RBC 4.30 3.87 - 5.11 MIL/uL   Hemoglobin 14.1 12.0 - 15.0 g/dL   HCT 41.0 36.0 - 46.0 %   MCV 95.3 80.0 - 100.0 fL   MCH 32.8 26.0 - 34.0 pg   MCHC 34.4 30.0 - 36.0 g/dL   RDW 12.5 11.5 - 15.5 %   Platelets 190 150 - 400 K/uL   nRBC 0.0 0.0 - 0.2 %   Neutrophils Relative % 80 %   Neutro Abs 7.8 (H) 1.7 - 7.7 K/uL   Lymphocytes Relative 12 %   Lymphs Abs 1.2 0.7 - 4.0 K/uL   Monocytes Relative 8 %    Monocytes Absolute 0.8 0.1 - 1.0 K/uL   Eosinophils Relative 0 %   Eosinophils Absolute 0.0 0.0 - 0.5 K/uL   Basophils Relative 0 %   Basophils Absolute 0.0 0.0 - 0.1 K/uL   Immature Granulocytes 0 %   Abs Immature Granulocytes 0.04 0.00 - 0.07 K/uL  Basic  metabolic panel     Status: Abnormal   Collection Time: 10/07/20  1:39 AM  Result Value Ref Range   Sodium 139 135 - 145 mmol/L   Potassium 3.9 3.5 - 5.1 mmol/L   Chloride 103 98 - 111 mmol/L   CO2 27 22 - 32 mmol/L   Glucose, Bld 124 (H) 70 - 99 mg/dL   BUN 17 8 - 23 mg/dL   Creatinine, Ser 0.88 0.44 - 1.00 mg/dL   Calcium 9.6 8.9 - 10.3 mg/dL   GFR, Estimated >60 >60 mL/min   Anion gap 9 5 - 15   Imaging Studies: CT ABDOMEN PELVIS W CONTRAST  Result Date: 10/07/2020 CLINICAL DATA:  Suprapubic abdominal pain, constipation EXAM: CT ABDOMEN AND PELVIS WITH CONTRAST TECHNIQUE: Multidetector CT imaging of the abdomen and pelvis was performed using the standard protocol following bolus administration of intravenous contrast. CONTRAST:  28m OMNIPAQUE IOHEXOL 350 MG/ML SOLN COMPARISON:  03/03/2015 FINDINGS: Lower chest: Lung bases are clear. Hepatobiliary: Liver is within normal limits. Tiny layering gallstones (series 2/image 18), without associated inflammatory changes. No intrahepatic or extrahepatic ductal dilatation. Pancreas: Within normal limits. Spleen: Within normal limits. Adrenals/Urinary Tract: Adrenal glands are within normal limits. Kidneys are within normal limits.  No hydronephrosis. Bladder is underdistended but unremarkable. Stomach/Bowel: Stomach is within normal limits. No evidence of bowel obstruction. Prior appendectomy. Very mild sigmoid diverticulosis, without evidence of diverticulitis. Mild to moderate left colonic stool burden, suggesting mild constipation. Vascular/Lymphatic: No evidence of abdominal aortic aneurysm. No suspicious abdominopelvic lymphadenopathy. Reproductive: Status post hysterectomy. No adnexal  masses. Other: No abdominopelvic ascites. Musculoskeletal: Visualized osseous structures are within normal limits. IMPRESSION: No evidence of bowel obstruction.  Prior appendectomy. Mild to moderate left colonic stool burden, suggesting mild constipation. Tiny layering gallstones, without associated inflammatory changes. Electronically Signed   By: SJulian HyM.D.   On: 10/07/2020 02:47    ED COURSE and MDM  Nursing notes, initial and subsequent vitals signs, including pulse oximetry, reviewed and interpreted by myself.  Vitals:   10/07/20 0058 10/07/20 0253  BP: (!) 170/109 (!) 142/94  Pulse: 97 87  Resp: 18 20  Temp: 97.9 F (36.6 C)   TempSrc: Oral   SpO2: 100% 99%   Medications  polyethylene glycol (MIRALAX / GLYCOLAX) packet 17 g (has no administration in time range)  iohexol (OMNIPAQUE) 350 MG/ML injection 100 mL (75 mLs Intravenous Contrast Given 10/07/20 0221)   Patient advised of CT findings consistent with constipation.  We will give a dose of MiraLAX.  She was advised to continue MiraLAX as needed until constipation resolves.   PROCEDURES  Procedures   ED DIAGNOSES     ICD-10-CM   1. Constipation in female  K59.00     2. Calculus of gallbladder without cholecystitis without obstruction  K80.20          Kaymen Adrian, MD 10/07/20 [id number]

## 2020-10-07 NOTE — ED Notes (Signed)
Patient transported to CT 

## 2020-10-07 NOTE — ED Triage Notes (Signed)
Pt presents to ED POV. Pt c/o suprapubic pain x3h. Pt reports constipation since Tues. Reports pain is 4/10.

## 2020-10-18 ENCOUNTER — Encounter: Payer: Self-pay | Admitting: Family Medicine

## 2020-11-14 ENCOUNTER — Ambulatory Visit: Payer: Medicare Other | Attending: Internal Medicine

## 2020-11-14 ENCOUNTER — Other Ambulatory Visit (HOSPITAL_BASED_OUTPATIENT_CLINIC_OR_DEPARTMENT_OTHER): Payer: Self-pay

## 2020-11-14 ENCOUNTER — Other Ambulatory Visit: Payer: Self-pay

## 2020-11-14 DIAGNOSIS — Z23 Encounter for immunization: Secondary | ICD-10-CM

## 2020-11-14 MED ORDER — PFIZER COVID-19 VAC BIVALENT 30 MCG/0.3ML IM SUSP
INTRAMUSCULAR | 0 refills | Status: DC
  Filled 2020-11-14: qty 0.3, 1d supply, fill #0

## 2020-11-14 NOTE — Progress Notes (Signed)
   Covid-19 Vaccination Clinic  Name:  Erica Kramer    MRN: [id number] DOB: September 16, 1955  11/14/2020  Erica Kramer was observed post Covid-19 immunization for 15 minutes without incident. She was provided with Vaccine Information Sheet and instruction to access the V-Safe system.   Erica Kramer was instructed to call 911 with any severe reactions post vaccine: Difficulty breathing  Swelling of face and throat  A fast heartbeat  A bad rash all over body  Dizziness and weakness   Immunizations Administered     Name Date Dose VIS Date Route   Pfizer Covid-19 Vaccine Bivalent Booster 11/14/2020  2:09 PM 0.3 mL 09/21/2020 Intramuscular   Manufacturer: [place]   Lot: [id number]   [place]: [phone]

## 2020-11-22 ENCOUNTER — Telehealth: Payer: Self-pay | Admitting: Family Medicine

## 2020-11-22 ENCOUNTER — Other Ambulatory Visit (HOSPITAL_BASED_OUTPATIENT_CLINIC_OR_DEPARTMENT_OTHER): Payer: Self-pay

## 2020-11-22 MED ORDER — INFLUENZA VAC A&B SA ADJ QUAD 0.5 ML IM PRSY
PREFILLED_SYRINGE | INTRAMUSCULAR | 0 refills | Status: AC
  Filled 2020-11-22: qty 0.5, 1d supply, fill #0

## 2020-11-22 NOTE — Telephone Encounter (Signed)
Left message for patient to call back and schedule Medicare Annual Wellness Visit (AWV) either virtually or in office. Left  my Herbie Drape number [phone]   AWV-I PER [place] 08/23/19  please schedule at anytime with [facility] Nurse Health Advisor 1 or 2   This should be a 45 minute visit.

## 2020-12-16 ENCOUNTER — Other Ambulatory Visit: Payer: Self-pay | Admitting: Family Medicine

## 2020-12-27 ENCOUNTER — Other Ambulatory Visit: Payer: Self-pay | Admitting: Family Medicine

## 2021-01-02 DIAGNOSIS — H2513 Age-related nuclear cataract, bilateral: Secondary | ICD-10-CM | POA: Diagnosis not present

## 2021-01-02 DIAGNOSIS — H04123 Dry eye syndrome of bilateral lacrimal glands: Secondary | ICD-10-CM | POA: Diagnosis not present

## 2021-01-02 DIAGNOSIS — H18513 Endothelial corneal dystrophy, bilateral: Secondary | ICD-10-CM | POA: Diagnosis not present

## 2021-01-02 DIAGNOSIS — H40013 Open angle with borderline findings, low risk, bilateral: Secondary | ICD-10-CM | POA: Diagnosis not present

## 2021-02-13 ENCOUNTER — Ambulatory Visit (INDEPENDENT_AMBULATORY_CARE_PROVIDER_SITE_OTHER): Payer: Medicare Other | Admitting: Family Medicine

## 2021-02-13 VITALS — BP 120/70 | HR 87 | Temp 97.9°F | Wt 143.2 lb

## 2021-02-13 DIAGNOSIS — B349 Viral infection, unspecified: Secondary | ICD-10-CM

## 2021-02-13 DIAGNOSIS — H9202 Otalgia, left ear: Secondary | ICD-10-CM

## 2021-02-13 NOTE — Patient Instructions (Signed)
Suspect resolving viral syndrome  Let me know if you develop any fever or worsening symptoms.

## 2021-02-13 NOTE — Progress Notes (Signed)
Established Patient Office Visit  Subjective:  Patient ID: Erica Kramer, female    DOB: [birth year]  Age: 66 y.o. MRN: [id number]  CC:  Chief Complaint  Patient presents with   Otalgia    X 8 days, ear pain, sore throat, headache, covid test negative at home,     HPI Erica Kramer presents for onset about 8 days ago of left earache, sore throat, nasal congestion, fatigue.  No fever.  She did home COVID test about 4 days into illness which came back negative.  She actually states she is feeling some better today.  No chills.  Sore throat symptoms slightly improved.  She had some leftover Elocon lotion which she applied to her left ear and seem to feel somewhat better afterwards.  She is not had any ear drainage.  No dyspnea.  No nausea or vomiting.  Past Medical History:  Diagnosis Date   Allergy    mild   Anemia    as child only   Anxiety    no medicines   Chronic frontal sinusitis 01/28/2007   Qualifier: Diagnosis of  By: Arnoldo Morale MD, John E    COLONIC POLYPS, ADENOMATOUS, HX OF 08/30/2008   Qualifier: Diagnosis of  By: Harlon Ditty CMA (AAMA), Dottie     Constipation    diet controlled mainly   Diverticulosis    DJD (degenerative joint disease)    GERD 09/02/2008   Qualifier: Diagnosis of  By: Fuller Plan MD Lamont Snowball T    Glaucoma    Headache(784.0)    HEMATOCHEZIA 09/02/2008   Qualifier: Diagnosis of  By: Fuller Plan MD Lamont Snowball T    Macular degeneration syndrome    MONILIASIS, SKIN/NAILS 01/28/2007   Qualifier: Diagnosis of  By: Arnoldo Morale MD, John E    Neck pain, chronic    Osteopenia    PONV (postoperative nausea and vomiting)    Rosacea    TMJ (temporomandibular joint disorder)     Past Surgical History:  Procedure Laterality Date   ABDOMINAL HYSTERECTOMY     COLONOSCOPY  04/22/2019   LAPAROSCOPIC APPENDECTOMY  03/12/2011   Procedure: APPENDECTOMY LAPAROSCOPIC;  Surgeon: Merrie Roof, MD;  Location: [facility];  Service: General;  Laterality: N/A;    POLYPECTOMY     TONSILLECTOMY     UPPER GASTROINTESTINAL ENDOSCOPY  04/22/2019    Family History  Problem Relation Age of Onset   Breast cancer Mother    Colon polyps Father    Barrett's esophagus Father    Heart disease Other    Cancer Other    Colon cancer Other    Diabetes Other    Breast cancer Maternal Aunt    Rectal cancer Neg Hx    Stomach cancer Neg Hx    Esophageal cancer Neg Hx     Social History   Socioeconomic History   Marital status: Married    Spouse name: Not on file   Number of children: Not on file   Years of education: Not on file   Highest education level: Not on file  Occupational History   Not on file  Tobacco Use   Smoking status: Never   Smokeless tobacco: Never  Vaping Use   Vaping Use: Never used  Substance and Sexual Activity   Alcohol use: Yes    Comment: occasional wine-2 to 3 glasses per year   Drug use: No   Sexual activity: Yes  Other Topics Concern   Not on file  Social History Narrative  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 [employer] Situation: lives with husband - kids are 57 and 104      Spiritual Beliefs: Christian and Clinical cytogeneticist      Lifestyle: not regular exercise 2 times per week; diet is ok      Social Determinants of Radio broadcast assistant Strain: Not on file  Food Insecurity: Not on file  Transportation Needs: Not on file  Physical Activity: Not on file  Stress: Not on file  Social Connections: Not on file  Intimate Partner Violence: Not on file    Outpatient Medications Prior to Visit  Medication Sig Dispense Refill   calcium-vitamin D (OSCAL WITH D) 500-200 MG-UNIT per tablet Take 1 tablet by mouth daily. Takes on and off     ciclopirox (PENLAC) 8 % solution Apply over nail and surrounding skin. Apply daily over previous coat. After seven (7) days, may remove with alcohol and continue cycle. 6.6 mL 5   COVID-19 mRNA bivalent vaccine, Pfizer,  (PFIZER COVID-19 VAC BIVALENT) injection Inject into the muscle. 0.3 mL 0   ergocalciferol (VITAMIN D2) 50000 UNITS capsule Take 50,000 Units by mouth once a week.     estradiol (ESTRACE) 0.1 MG/GM vaginal cream Place 1 Applicatorful vaginally as needed.     famotidine (PEPCID) 40 MG tablet TAKE 1 TABLET BY MOUTH TWICE A DAY 180 tablet 1   fluticasone (FLONASE) 50 MCG/ACT nasal spray Place into both nostrils daily.     influenza vaccine adjuvanted (FLUAD) 0.5 ML injection Inject into the muscle. 0.5 mL 0   latanoprost (XALATAN) 0.005 % ophthalmic solution Place 1 drop into both eyes at bedtime.       tiZANidine (ZANAFLEX) 2 MG tablet Take 1 tablet (2 mg total) by mouth at bedtime. 20 tablet 0   valACYclovir (VALTREX) 500 MG tablet TAKE 1 TABLET BY MOUTH EVERY DAY 90 tablet 1   No facility-administered medications prior to visit.    No Known Allergies  ROS Review of Systems  Constitutional:  Positive for fatigue. Negative for chills and fever.  HENT:  Positive for congestion and ear pain. Negative for facial swelling.   Respiratory:  Negative for cough, shortness of breath and wheezing.      Objective:    Physical Exam Vitals reviewed.  Constitutional:      Appearance: Normal appearance.  HENT:     Ears:     Comments: Has little to cerumen in the left canal.  Removed with curette without difficulty.  She has little bit of a white film over her eardrum and wonder if this is related to recent application of Elocon lotion.  No evidence for otitis externa.  Portion of eardrum visualized appears normal.  No erythema. Cardiovascular:     Rate and Rhythm: Normal rate and regular rhythm.  Pulmonary:     Effort: Pulmonary effort is normal.     Breath sounds: Normal breath sounds.  Musculoskeletal:     Cervical back: Neck supple.  Lymphadenopathy:     Cervical: No cervical adenopathy.  Neurological:     Mental Status: She is alert.    BP 120/70 (BP Location: Left Arm, Patient  Position: Sitting, Cuff Size: Normal)    Pulse 87    Temp 97.9 F (36.6 C) (Oral)    Wt 143 lb 3.2 oz (65 kg)    SpO2 99%    BMI 24.01 kg/m  Wt Readings from  Last 3 Encounters:  02/13/21 143 lb 3.2 oz (65 kg)  09/16/20 139 lb 6.4 oz (63.2 kg)  06/02/19 134 lb 6.4 oz (61 kg)     Health Maintenance Due  Topic Date Due   Zoster Vaccines- Shingrix (2 of 2) 01/09/2018   TETANUS/TDAP  12/01/2018   Pneumonia Vaccine 2+ Years old (2 - PCV) 10/29/2019    There are no preventive care reminders to display for this patient.  Lab Results  Component Value Date   TSH 2.75 09/19/2020   Lab Results  Component Value Date   WBC 9.9 10/07/2020   HGB 14.1 10/07/2020   HCT 41.0 10/07/2020   MCV 95.3 10/07/2020   PLT 190 10/07/2020   Lab Results  Component Value Date   NA 139 10/07/2020   K 3.9 10/07/2020   CO2 27 10/07/2020   GLUCOSE 124 (H) 10/07/2020   BUN 17 10/07/2020   CREATININE 0.88 10/07/2020   BILITOT 0.5 09/19/2020   ALKPHOS 70 09/19/2020   AST 18 09/19/2020   ALT 19 09/19/2020   PROT 6.7 09/19/2020   ALBUMIN 4.1 09/19/2020   CALCIUM 9.6 10/07/2020   ANIONGAP 9 10/07/2020   GFR 73.14 09/19/2020   Lab Results  Component Value Date   CHOL 205 (H) 09/19/2020   Lab Results  Component Value Date   HDL 65.30 09/19/2020   Lab Results  Component Value Date   LDLCALC 112 (H) 09/19/2020   Lab Results  Component Value Date   TRIG 139.0 09/19/2020   Lab Results  Component Value Date   CHOLHDL 3 09/19/2020   Lab Results  Component Value Date   HGBA1C 5.7 09/19/2020      Assessment & Plan:   Probable recent viral syndrome with left otalgia.  No acute findings on exam.  Lungs clear.  Home COVID testing negative.  -Follow-up immediately for any fever or worsening symptoms.  It sounds like she is starting to improve some at this point we recommend observation at this time.  Plenty fluids and rest.  Touch base if left ear symptoms not fully resolving over the next  several days  No orders of the defined types were placed in this encounter.   Follow-up: No follow-ups on file.    Carolann Littler, MD

## 2021-02-28 ENCOUNTER — Telehealth: Payer: Self-pay | Admitting: Family Medicine

## 2021-02-28 NOTE — Telephone Encounter (Signed)
Left message for patient to call back and schedule Medicare Annual Wellness Visit (AWV) either virtually or in office. Left  my Erica Kramer number [phone]   AWV-I PER [place] 08/23/19   ; please schedule at anytime with [facility] Nurse Health Advisor 1 or 2   This should be a 45 minute visit.

## 2021-03-20 ENCOUNTER — Encounter: Payer: Self-pay | Admitting: Family Medicine

## 2021-03-20 ENCOUNTER — Ambulatory Visit (INDEPENDENT_AMBULATORY_CARE_PROVIDER_SITE_OTHER): Payer: Medicare Other | Admitting: Family Medicine

## 2021-03-20 VITALS — BP 120/90 | HR 87 | Temp 97.8°F | Ht 64.75 in | Wt 137.6 lb

## 2021-03-20 DIAGNOSIS — H9202 Otalgia, left ear: Secondary | ICD-10-CM

## 2021-03-20 MED ORDER — VALACYCLOVIR HCL 500 MG PO TABS: 500.0000 mg | ORAL_TABLET | Freq: Every day | ORAL | 1 refills | Status: DC

## 2021-03-20 NOTE — Progress Notes (Signed)
Erica Kramer DOB: [birth year] Encounter date: 03/20/2021  This is a 66 y.o. female who presents with Chief Complaint  Patient presents with   Ear Pain    Patient complains of right ear itching, aching, facial pain and headache intermittently for years, worse past 2 weeks and resolved as of 1 week ago    History of present illness: She was seen on 1/23 by Dr. Elease Hashimoto for 8 days of ear pain, along with other URI symptoms.  Has had ongoing ear problem - starts with itch, then works up to feeling like pain/blister/sore in ear. Headache on one side of head through area of pain. In years past, there was blister that was seen by doc; stated like herpes blister. Then several years ago had shingles- through ear. Got better with valcyclovir. Returned for recheck. Not fully typed; was viral in herpes family. Given rx for acyclovir. Has been hit or miss with this.   States that prior to seeing Dr. Shane Crutch had been sick close to 2 weeks. Multiple sx at that time. Having "great" ear day today. Just a little itchy.   Wanted to come up with plan for this rather than seeing ENT.   Just feels like she isn't seen when she is having problem and wants to be seen when she is having acute issue. Was only flaring 1-2 x/year. Initially was in December when she was busy, losing sleep. Now feels like it it comes a little more.   Hasn't taken valacyclovir for over a week now. Sometimes ear feels better with flonase use (not sure if that is separate problem). Not a big medication person. Really tries not to take anything. Hasn't taken the valtrex as preventative on daily basis in the past. Itching of ear comes and goes. Does fel like that relates to pain. Everything with ear is internal. Initially with pain would get blister on outside of ear, but everything is internal now.   Hard to lay on side, hurts to lay head on pillow, head hurts when getting flared. Has felt feverish, but hasn't had one. Always feels she  gets some swelling in the ear; feels like ear is more closed/touching. When that happens doesn't hear as well. Feels full. No drainage.   Uses mometasone topical solution with this last flare. It did helped with itching.        No Known Allergies Current Meds  Medication Sig   calcium-vitamin D (OSCAL WITH D) 500-200 MG-UNIT per tablet Take 1 tablet by mouth daily. Takes on and off   ciclopirox (PENLAC) 8 % solution Apply over nail and surrounding skin. Apply daily over previous coat. After seven (7) days, may remove with alcohol and continue cycle.   COVID-19 mRNA bivalent vaccine, Pfizer, (PFIZER COVID-19 VAC BIVALENT) injection Inject into the muscle.   ergocalciferol (VITAMIN D2) 50000 UNITS capsule Take 50,000 Units by mouth once a week.   estradiol (ESTRACE) 0.1 MG/GM vaginal cream Place 1 Applicatorful vaginally as needed.   famotidine (PEPCID) 40 MG tablet TAKE 1 TABLET BY MOUTH TWICE A DAY   fluticasone (FLONASE) 50 MCG/ACT nasal spray Place into both nostrils daily.   influenza vaccine adjuvanted (FLUAD) 0.5 ML injection Inject into the muscle.   latanoprost (XALATAN) 0.005 % ophthalmic solution Place 1 drop into both eyes at bedtime.     tiZANidine (ZANAFLEX) 2 MG tablet Take 1 tablet (2 mg total) by mouth at bedtime.   [DISCONTINUED] valACYclovir (VALTREX) 500 MG tablet TAKE 1 TABLET BY MOUTH EVERY  DAY    Review of Systems  Constitutional:  Negative for chills, fatigue and fever.  HENT:  Positive for ear pain (recurrent left ear). Negative for congestion, hearing loss, sinus pressure, sinus pain and sore throat.   Respiratory:  Negative for cough, chest tightness, shortness of breath and wheezing.   Cardiovascular:  Negative for chest pain, palpitations and leg swelling.   Objective:  BP 120/90 (BP Location: Left Arm, Patient Position: Sitting, Cuff Size: Normal)    Pulse 87    Temp 97.8 F (36.6 C) (Oral)    Ht 5' 4.75" (1.645 m)    Wt 137 lb 9.6 oz (62.4 kg)    SpO2 97%     BMI 23.07 kg/m   Weight: 137 lb 9.6 oz (62.4 kg)   BP Readings from Last 3 Encounters:  03/20/21 120/90  02/13/21 120/70  10/07/20 138/74   Wt Readings from Last 3 Encounters:  03/20/21 137 lb 9.6 oz (62.4 kg)  02/13/21 143 lb 3.2 oz (65 kg)  09/16/20 139 lb 6.4 oz (63.2 kg)    Physical Exam Constitutional:      General: She is not in acute distress.    Appearance: She is well-developed.  HENT:     Right Ear: Hearing, ear canal and external ear normal.     Left Ear: Hearing, ear canal and external ear normal.     Ears:     Comments: Bilat TM appear very dry/may be dry skin in canal that is overlying TM?  Cardiovascular:     Rate and Rhythm: Normal rate and regular rhythm.     Heart sounds: Normal heart sounds. No murmur heard.   No friction rub.  Pulmonary:     Effort: Pulmonary effort is normal. No respiratory distress.     Breath sounds: Normal breath sounds. No wheezing or rales.  Musculoskeletal:     Right lower leg: No edema.     Left lower leg: No edema.  Neurological:     Mental Status: She is alert and oriented to person, place, and time.  Psychiatric:        Behavior: Behavior normal.    Assessment/Plan 1. 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  Symptoms are the same and recur in the same ear (left).  They are associated with an overall sick feeling, headache, increased facial pain.  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  I did advise that we could potentially do a viral swab on the ear when she has symptoms to try to get a diagnosis.  We discussed using Valtrex and higher doses in the acute phase of symptoms to better/more quickly because resolution of symptoms.  We discussed another option would be taking the Valtrex daily from a preventative standpoint.  Pain from prior episode has resolved.  I do think given the  dryness in the ears and recurrence of symptoms it is reasonable for her to see ENT for further evaluation.  Referral has been placed today. I did advise her to stop using steroid drops in ear. - Ambulatory referral to ENT   Return if symptoms worsen or fail to improve.  35 minutes spent in chart review,discussion of symptoms, discussion of treatment and follow-up options, exam, charting.   Micheline Rough, MD

## 2021-03-20 NOTE — Patient Instructions (Addendum)
If you have episode - then consider valtrex 2000mg  (4 tablets) twice daily x 1 day.   Could consider taking the valtrex daily as preventative.

## 2021-05-01 DIAGNOSIS — L918 Other hypertrophic disorders of the skin: Secondary | ICD-10-CM | POA: Diagnosis not present

## 2021-05-01 DIAGNOSIS — L738 Other specified follicular disorders: Secondary | ICD-10-CM | POA: Diagnosis not present

## 2021-05-01 DIAGNOSIS — L814 Other melanin hyperpigmentation: Secondary | ICD-10-CM | POA: Diagnosis not present

## 2021-05-01 DIAGNOSIS — D0461 Carcinoma in situ of skin of right upper limb, including shoulder: Secondary | ICD-10-CM | POA: Diagnosis not present

## 2021-05-04 ENCOUNTER — Telehealth: Payer: Self-pay | Admitting: Family Medicine

## 2021-05-04 NOTE — Telephone Encounter (Signed)
Spoke with patient to  ?schedule Medicare Annual Wellness Visit (AWV) either virtually or in office.  ? ?She stated she will call back  has work meeting and could not schedule at that time ? ? AWV-I PER [place] 08/23/19  ?please schedule at anytime with [facility] Nurse Health Advisor 1 or 2 ? ? ?This should be a 45 minute visit.  ?

## 2021-05-05 NOTE — Telephone Encounter (Signed)
Patient returned my call she has several unexpected appointments coming up and will call back to schedule ?

## 2021-05-15 DIAGNOSIS — D0461 Carcinoma in situ of skin of right upper limb, including shoulder: Secondary | ICD-10-CM | POA: Diagnosis not present

## 2021-05-15 DIAGNOSIS — L988 Other specified disorders of the skin and subcutaneous tissue: Secondary | ICD-10-CM | POA: Diagnosis not present

## 2021-06-02 ENCOUNTER — Other Ambulatory Visit: Payer: Self-pay | Admitting: Family Medicine

## 2021-06-05 DIAGNOSIS — H903 Sensorineural hearing loss, bilateral: Secondary | ICD-10-CM | POA: Diagnosis not present

## 2021-06-05 DIAGNOSIS — H6122 Impacted cerumen, left ear: Secondary | ICD-10-CM | POA: Diagnosis not present

## 2021-06-05 DIAGNOSIS — H9313 Tinnitus, bilateral: Secondary | ICD-10-CM | POA: Diagnosis not present

## 2021-06-26 ENCOUNTER — Telehealth: Payer: Self-pay | Admitting: Family Medicine

## 2021-06-26 NOTE — Telephone Encounter (Signed)
Patient returned my call.  Stating she wanted to wait till fall to schedule

## 2021-06-26 NOTE — Telephone Encounter (Signed)
Left message for patient to call back and schedule Medicare Annual Wellness Visit (AWV) either virtually or in office. Left  my Erica Kramer number [phone]   AWV-I PER [place] 08/23/19  ; please schedule at anytime with [facility] Nurse Health Advisor 1 or 2   This should be a 45 minute visit.

## 2021-07-03 DIAGNOSIS — H1132 Conjunctival hemorrhage, left eye: Secondary | ICD-10-CM | POA: Diagnosis not present

## 2021-07-03 DIAGNOSIS — H40013 Open angle with borderline findings, low risk, bilateral: Secondary | ICD-10-CM | POA: Diagnosis not present

## 2021-07-03 DIAGNOSIS — H2513 Age-related nuclear cataract, bilateral: Secondary | ICD-10-CM | POA: Diagnosis not present

## 2021-07-03 DIAGNOSIS — H18513 Endothelial corneal dystrophy, bilateral: Secondary | ICD-10-CM | POA: Diagnosis not present

## 2021-08-25 ENCOUNTER — Other Ambulatory Visit: Payer: Self-pay | Admitting: Family Medicine

## 2021-08-25 DIAGNOSIS — Z1231 Encounter for screening mammogram for malignant neoplasm of breast: Secondary | ICD-10-CM

## 2021-08-30 DIAGNOSIS — B3731 Acute candidiasis of vulva and vagina: Secondary | ICD-10-CM | POA: Diagnosis not present

## 2021-09-06 ENCOUNTER — Ambulatory Visit
Admission: RE | Admit: 2021-09-06 | Discharge: 2021-09-06 | Disposition: A | Discharge status: Home / Self Care | Payer: Medicare Other | Source: Ambulatory Visit | Attending: Family Medicine | Admitting: Family Medicine

## 2021-09-06 DIAGNOSIS — Z1231 Encounter for screening mammogram for malignant neoplasm of breast: Secondary | ICD-10-CM

## 2021-11-13 ENCOUNTER — Other Ambulatory Visit (HOSPITAL_BASED_OUTPATIENT_CLINIC_OR_DEPARTMENT_OTHER): Payer: Self-pay

## 2021-11-13 MED ORDER — COMIRNATY 30 MCG/0.3ML IM SUSY
PREFILLED_SYRINGE | INTRAMUSCULAR | 0 refills | Status: DC
  Filled 2021-11-13: qty 0.3, 1d supply, fill #0

## 2021-11-21 ENCOUNTER — Other Ambulatory Visit (HOSPITAL_BASED_OUTPATIENT_CLINIC_OR_DEPARTMENT_OTHER): Payer: Self-pay

## 2021-11-21 MED ORDER — INFLUENZA VAC SPLIT QUAD 0.5 ML IM SUSY
PREFILLED_SYRINGE | INTRAMUSCULAR | 0 refills | Status: AC
  Filled 2021-11-21: qty 0.5, 1d supply, fill #0

## 2021-11-27 ENCOUNTER — Telehealth: Payer: Self-pay | Admitting: Family Medicine

## 2021-11-27 NOTE — Telephone Encounter (Signed)
Left message for patient to call back and schedule Medicare Annual Wellness Visit (AWV) either virtually or in office. Left  my Erica Kramer number [phone]   AWV-I PER [place] 08/23/19  please schedule with Nurse Health Adviser   45 min for awv-i and in office appointments 30 min for awv-s  phone/virtual appointments

## 2021-11-27 NOTE — Telephone Encounter (Signed)
Pt called to say: Thank you, but she is not interested in this kind of visit any time soon. Adding that she needs to focus on finding a PCP. Pt was informed that B. Legrand Como, MD took over for Dr. Ethlyn Gallery. Pt stated she would need to do some more research before making a decision.

## 2021-12-06 DIAGNOSIS — H6123 Impacted cerumen, bilateral: Secondary | ICD-10-CM | POA: Diagnosis not present

## 2021-12-06 DIAGNOSIS — H9202 Otalgia, left ear: Secondary | ICD-10-CM | POA: Diagnosis not present

## 2021-12-06 DIAGNOSIS — H903 Sensorineural hearing loss, bilateral: Secondary | ICD-10-CM | POA: Diagnosis not present

## 2022-01-08 DIAGNOSIS — H04123 Dry eye syndrome of bilateral lacrimal glands: Secondary | ICD-10-CM | POA: Diagnosis not present

## 2022-01-08 DIAGNOSIS — H2513 Age-related nuclear cataract, bilateral: Secondary | ICD-10-CM | POA: Diagnosis not present

## 2022-01-08 DIAGNOSIS — H18513 Endothelial corneal dystrophy, bilateral: Secondary | ICD-10-CM | POA: Diagnosis not present

## 2022-01-08 DIAGNOSIS — H40013 Open angle with borderline findings, low risk, bilateral: Secondary | ICD-10-CM | POA: Diagnosis not present

## 2022-02-28 DIAGNOSIS — K080 Exfoliation of teeth due to systemic causes: Secondary | ICD-10-CM | POA: Diagnosis not present

## 2022-03-13 ENCOUNTER — Encounter: Payer: Self-pay | Admitting: Gastroenterology

## 2022-05-23 ENCOUNTER — Telehealth: Payer: Self-pay | Admitting: Gastroenterology

## 2022-05-23 NOTE — Telephone Encounter (Signed)
Patient called regarding scheduling a endoscopy and colonoscopy. States she is over due for both. She does have a recall for the colonoscopy. She is wondering if she could have these procedures scheduled together. She is also out of refill for her Famotidine medication and is requesting a refill. She is scheduled for an office visit on 07/23. Requesting to speak with nurse to see what could be done before then. Please advise, thank you.

## 2022-05-24 NOTE — Telephone Encounter (Signed)
The pt has been advised to keep the appt as planned with Dr Russella Dar.  She has also been advised that she can get the PEPCID OTC.

## 2022-06-04 DIAGNOSIS — H9202 Otalgia, left ear: Secondary | ICD-10-CM | POA: Diagnosis not present

## 2022-06-04 DIAGNOSIS — H9042 Sensorineural hearing loss, unilateral, left ear, with unrestricted hearing on the contralateral side: Secondary | ICD-10-CM | POA: Diagnosis not present

## 2022-06-04 DIAGNOSIS — H6123 Impacted cerumen, bilateral: Secondary | ICD-10-CM | POA: Diagnosis not present

## 2022-06-06 IMAGING — MG DIGITAL SCREENING BILAT W/ TOMO W/ CAD
6 of 10 series · 6 of 30 positions shown · non-contrast
Comparison: Previous exam(s).

CLINICAL DATA: Screening.

EXAM:
DIGITAL SCREENING BILATERAL MAMMOGRAM WITH TOMO AND CAD

[L MLO synth-2D (1 of 2)]
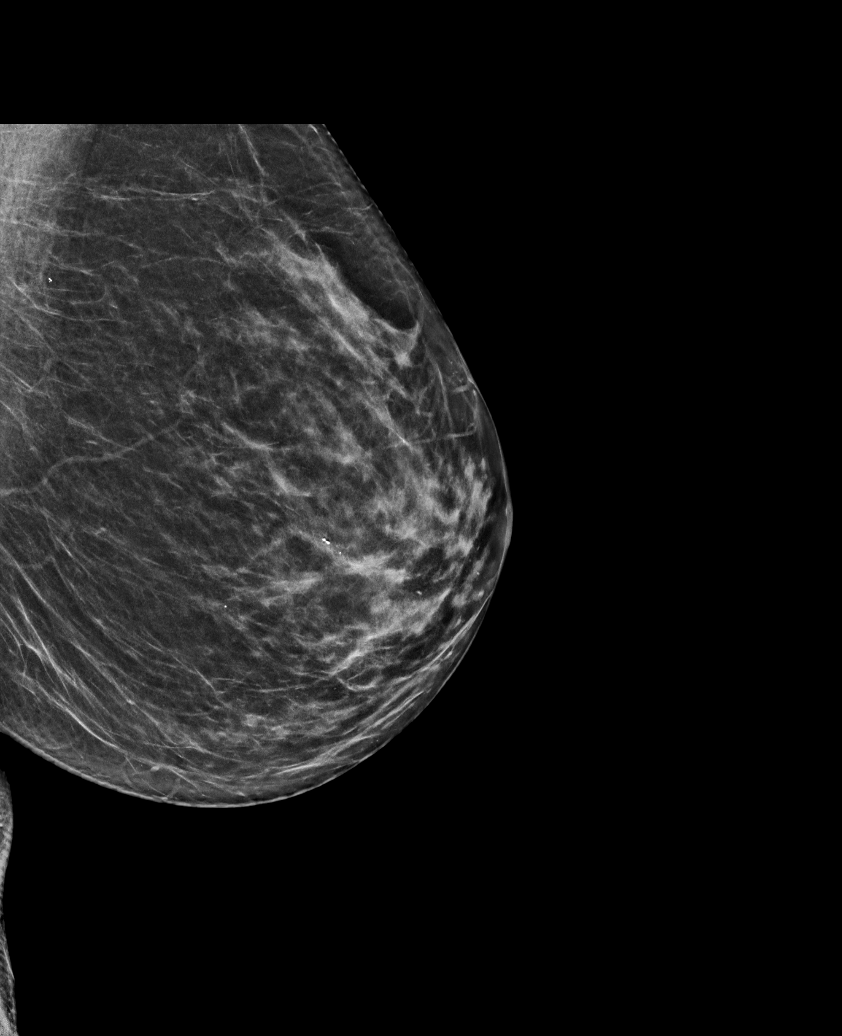

[R MLO synth-2D]
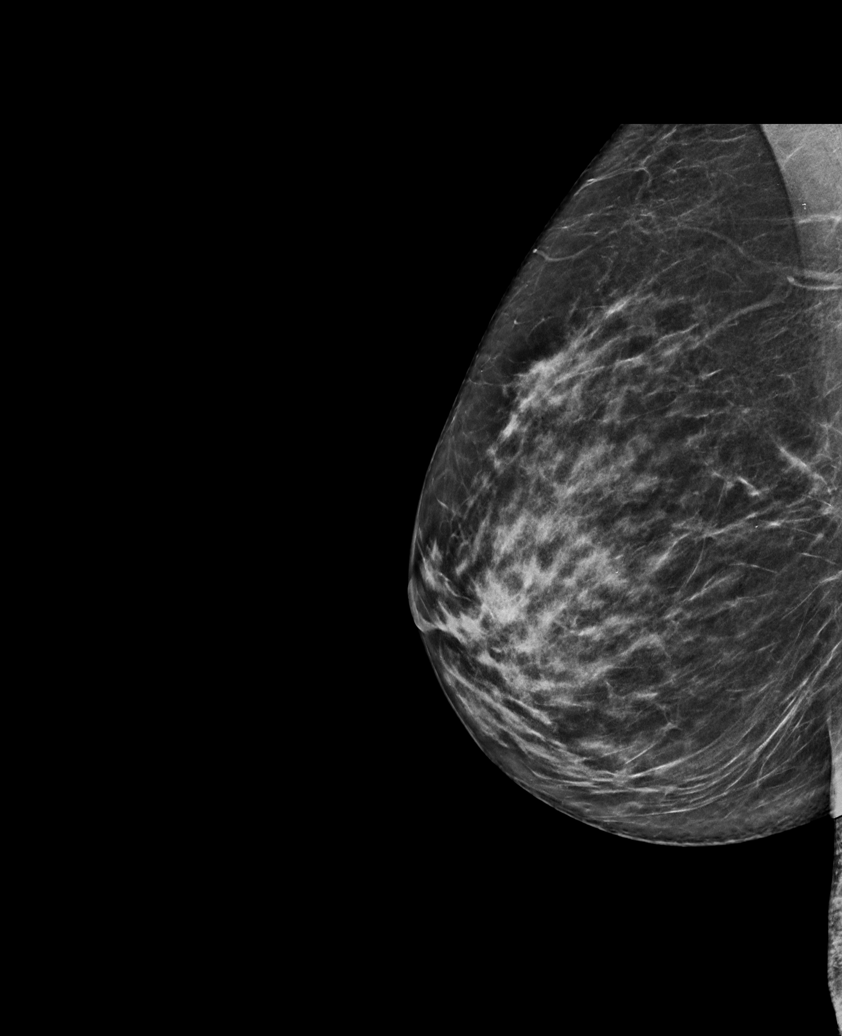

[L CC synth-2D]
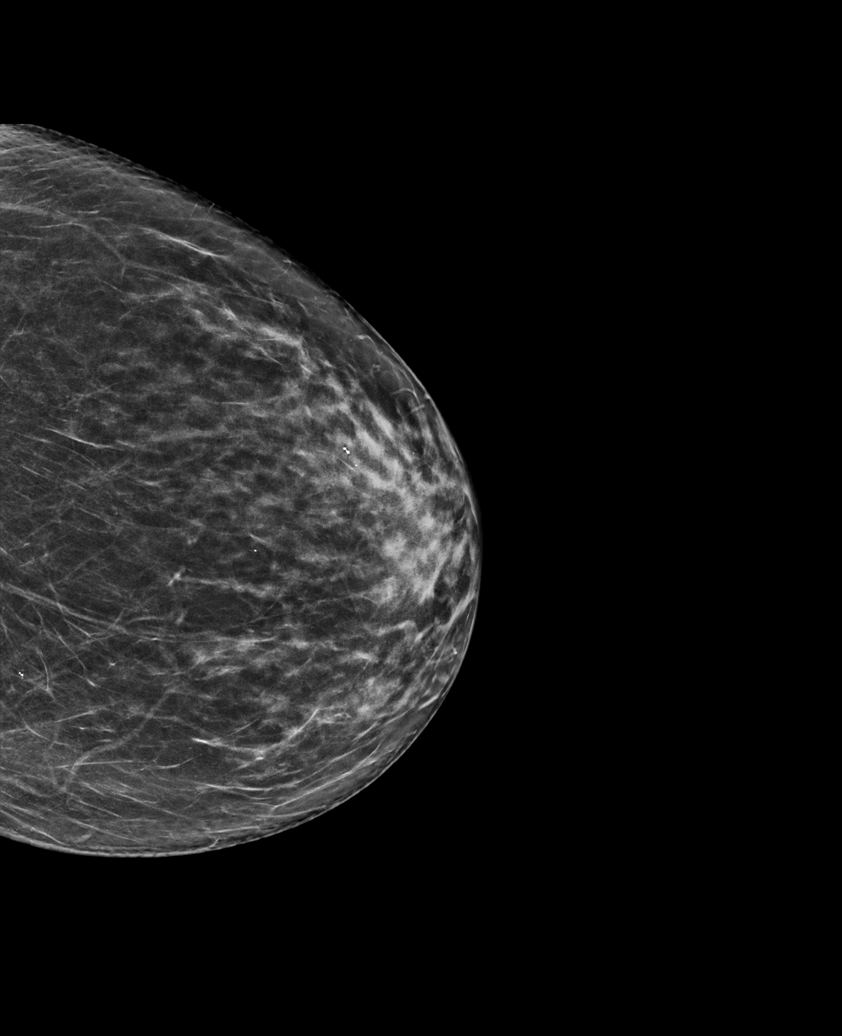

[R CC synth-2D]
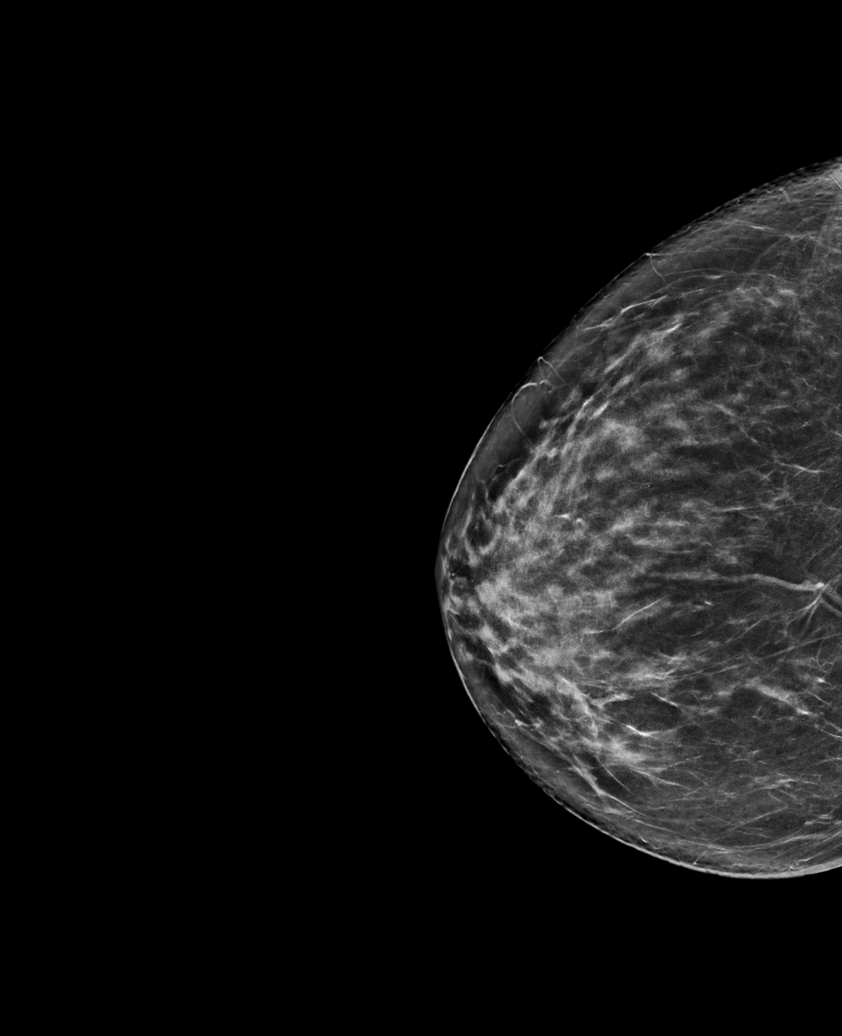

[L MLO synth-2D (2 of 2)]
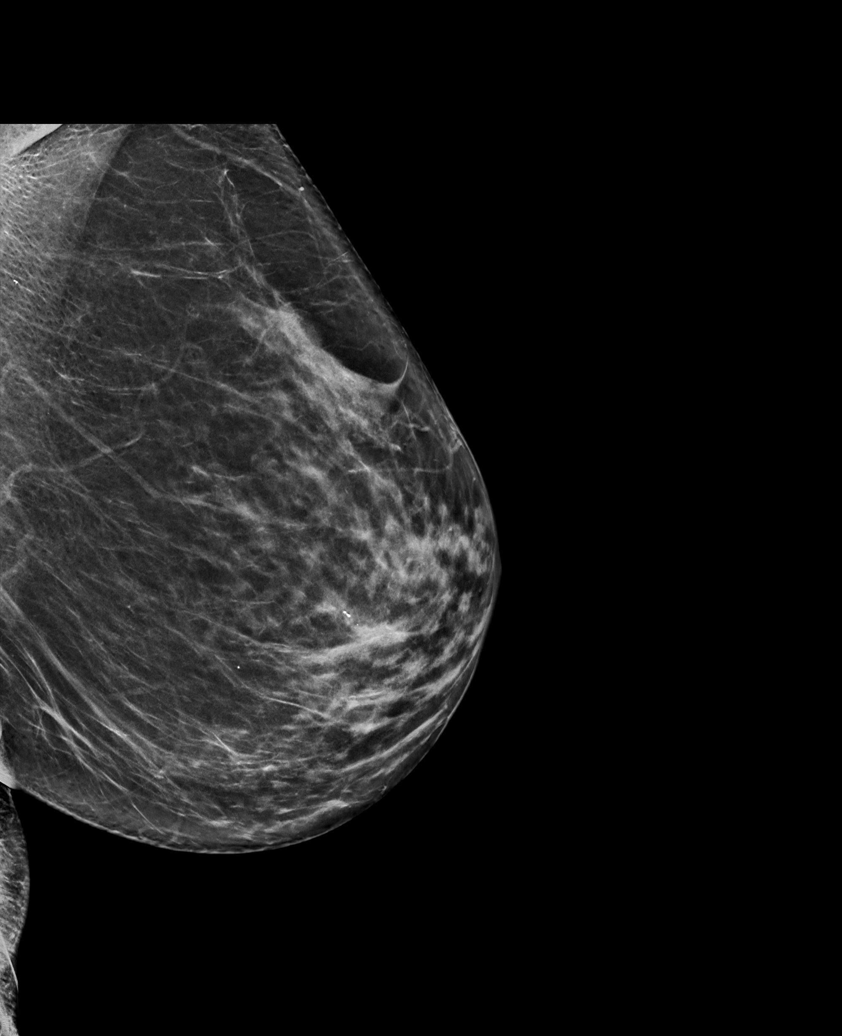

[R MLO tomo · tomo slice 35/69.0]
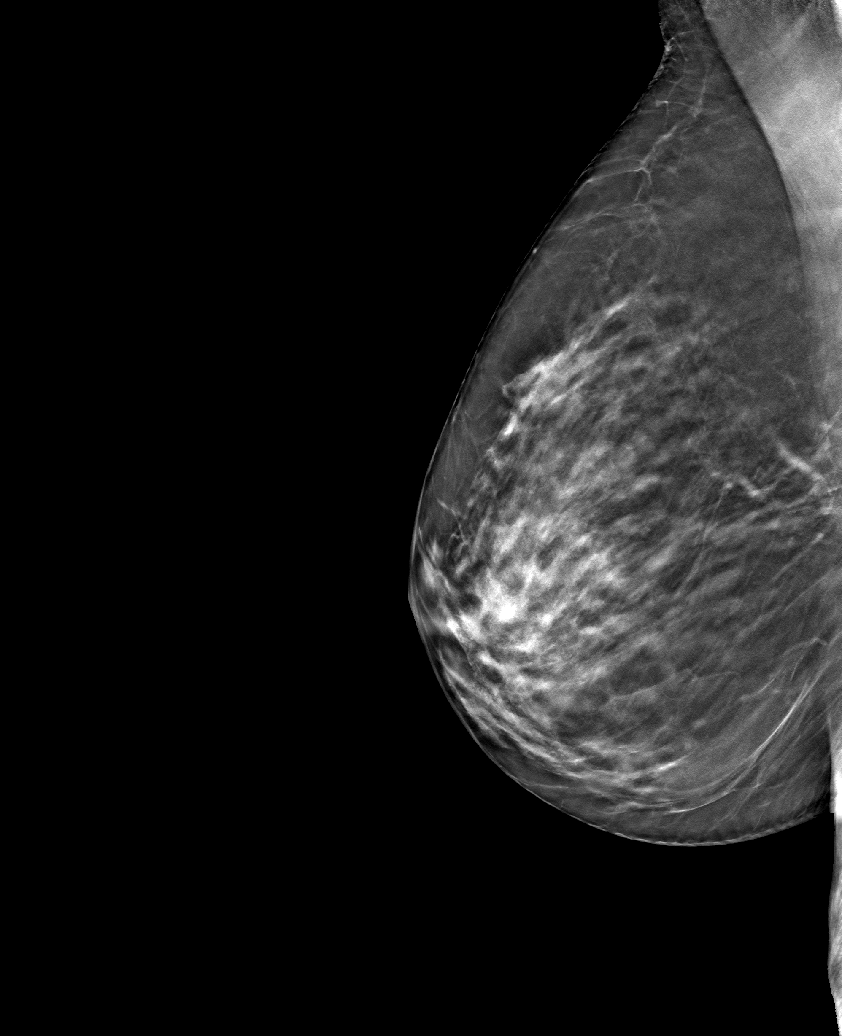

[6 of 30 positions shown; findings below may reference images not displayed]

ACR Breast Density Category c: The breast tissue is heterogeneously
dense, which may obscure small masses.
FINDINGS: There are no findings suspicious for malignancy. Images were
processed with CAD.
IMPRESSION: No mammographic evidence of malignancy. A result letter of this
screening mammogram will be mailed directly to the patient.

RECOMMENDATION:
Screening mammogram in one year. (Code:[id number])

BI-RADS CATEGORY  1: Negative.

## 2022-06-22 ENCOUNTER — Ambulatory Visit (INDEPENDENT_AMBULATORY_CARE_PROVIDER_SITE_OTHER): Payer: Medicare Other | Admitting: Family Medicine

## 2022-06-22 ENCOUNTER — Encounter: Payer: Self-pay | Admitting: Family Medicine

## 2022-06-22 VITALS — BP 100/60 | HR 80 | Temp 97.8°F | Ht 64.75 in | Wt 138.2 lb

## 2022-06-22 DIAGNOSIS — Z780 Asymptomatic menopausal state: Secondary | ICD-10-CM | POA: Diagnosis not present

## 2022-06-22 DIAGNOSIS — Z13220 Encounter for screening for lipoid disorders: Secondary | ICD-10-CM

## 2022-06-22 DIAGNOSIS — Z0000 Encounter for general adult medical examination without abnormal findings: Secondary | ICD-10-CM

## 2022-06-22 DIAGNOSIS — H409 Unspecified glaucoma: Secondary | ICD-10-CM | POA: Insufficient documentation

## 2022-06-22 DIAGNOSIS — B0221 Postherpetic geniculate ganglionitis: Secondary | ICD-10-CM | POA: Insufficient documentation

## 2022-06-22 NOTE — Progress Notes (Signed)
Complete physical exam  Patient: Erica Kramer   DOB: 04-10-1955   67 y.o. Female  MRN: [id number]  Subjective:    Chief Complaint  Patient presents with   Establish Care    Erica Kramer is a 67 y.o. female who presents today for a complete physical exam. She reports consuming a general diet. Home exercise routine includes walking daily and exercising daily. She generally feels well. She reports sleeping well. She does not have additional problems to discuss today.    Most recent fall risk assessment:    06/22/2022   10:58 AM  Fall Risk   Falls in the past year? 0  Number falls in past yr: 0  Injury with Fall? 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 Takes on and off   cholecalciferol (VITAMIN D3) 25 MCG (1000 UNIT) tablet Take 1,000 Units by mouth daily.   fluticasone (FLONASE) 50 MCG/ACT nasal spray Place into both nostrils daily.   influenza vac split quadrivalent PF (FLUARIX) 0.5 ML injection Inject into the muscle.   influenza vaccine adjuvanted (FLUAD) 0.5 ML injection Inject into the muscle.   latanoprost (XALATAN) 0.005 %  ophthalmic solution Place 1 drop into both eyes at bedtime.     PRESCRIPTION MEDICATION Estradiol vaginal cream 0.02%-1-2 times weekly   valACYclovir (VALTREX) 500 MG tablet Take 1 tablet (500 mg total) by mouth daily. (Patient taking differently: Take 500 mg by mouth as needed.)   [DISCONTINUED] ciclopirox (PENLAC) 8 % solution Apply over nail and surrounding skin. Apply daily over previous coat. After seven (7) days, may remove with alcohol and continue cycle.   [DISCONTINUED] COVID-19 mRNA bivalent vaccine, Pfizer, (PFIZER COVID-19 VAC BIVALENT) injection Inject into the muscle.   [DISCONTINUED] COVID-19 mRNA vaccine 2023-2024 (COMIRNATY) syringe Inject into the muscle.   [DISCONTINUED] famotidine (PEPCID) 40 MG tablet TAKE 1 TABLET BY MOUTH TWICE A DAY   [DISCONTINUED] ergocalciferol (VITAMIN D2) 50000 UNITS capsule Take 50,000 Units by mouth once a week.   [DISCONTINUED] estradiol (ESTRACE) 0.1 MG/GM vaginal cream Place 1 Applicatorful vaginally as needed.   [DISCONTINUED] tiZANidine (ZANAFLEX) 2 MG tablet Take 1 tablet (2 mg total) by mouth at bedtime.   No facility-administered medications prior to visit.    Review of Systems  HENT:  Negative for hearing loss.   Eyes:  Negative for blurred vision.  Respiratory:  Negative for shortness of breath.   Cardiovascular:  Negative for chest pain.  Gastrointestinal: Negative.   Genitourinary: Negative.   Musculoskeletal:  Negative for  back pain.  Neurological:  Negative for headaches.  Psychiatric/Behavioral:  Negative for depression.        Objective:     BP 100/60 (BP Location: Left Arm, Patient Position: Sitting, Cuff Size: Normal)   Pulse 80   Temp 97.8 F (36.6 C) (Oral)   Ht 5' 4.75" (1.645 m)   Wt 138 lb 3.2 oz (62.7 kg)   SpO2 98%   BMI 23.18 kg/m    Physical Exam Vitals reviewed.  Constitutional:      Appearance: Normal appearance. She is well-groomed and normal weight.  HENT:     Right Ear: Tympanic membrane  normal.     Left Ear: Tympanic membrane normal.     Mouth/Throat:     Mouth: Mucous membranes are moist.     Pharynx: No posterior oropharyngeal erythema.  Eyes:     Conjunctiva/sclera: Conjunctivae normal.  Neck:     Thyroid: No thyromegaly.  Cardiovascular:     Rate and Rhythm: Normal rate and regular rhythm.     Pulses: Normal pulses.     Heart sounds: S1 normal and S2 normal.  Pulmonary:     Effort: Pulmonary effort is normal.     Breath sounds: Normal breath sounds and air entry.  Abdominal:     General: Bowel sounds are normal.  Musculoskeletal:     Right lower leg: No edema.     Left lower leg: No edema.  Neurological:     Mental Status: She is alert and oriented to person, place, and time. Mental status is at baseline.     Gait: Gait is intact.  Psychiatric:        Mood and Affect: Mood and affect normal.        Speech: Speech normal.        Behavior: Behavior normal.        Judgment: Judgment normal.      No results found for any visits on 06/22/22.     Assessment & Plan:    Routine Health Maintenance and Physical Exam  Immunization History  Administered Date(s) Administered   COVID-19, mRNA, vaccine(Comirnaty)12 years and older 11/13/2021   Fluad Quad(high Dose 65+) 10/29/2018, 11/22/2020   Influenza Nasal 10/30/2010   Influenza Split 11/13/2011   Influenza Whole 11/25/2008   Influenza,inj,Quad PF,6+ Mos 09/22/2012, 10/27/2013, 11/07/2015, 10/12/2016, 11/07/2017   Influenza-Unspecified 11/08/2014   PFIZER(Purple Top)SARS-COV-2 Vaccination 03/14/2019, 04/07/2019, 11/17/2019, 05/12/2020   Pfizer Covid-19 Vaccine Bivalent Booster 63yrs & up 11/14/2020   Pneumococcal Polysaccharide-23 10/29/2018   Td 11/30/2008   Zoster Recombinat (Shingrix) 11/14/2017   Zoster, Live 10/19/2013    Health Maintenance  Topic Date Due   Medicare Annual Wellness (AWV)  Never done   DTaP/Tdap/Td (2 - Tdap) 12/01/2018   Pneumonia Vaccine 18+ Years old (2 of 2 - PCV)  10/29/2019   Colonoscopy  04/22/2022   Zoster Vaccines- Shingrix (2 of 2) 09/22/2022 (Originally 01/09/2018)   INFLUENZA VACCINE  08/23/2022   MAMMOGRAM  09/07/2023   DEXA SCAN  Completed   COVID-19 Vaccine  Completed   Hepatitis C Screening  Completed   HPV VACCINES  Aged Out    Discussed health benefits of physical activity, and encouraged her to engage in regular exercise appropriate for her age and condition.  Lipid screening -     Lipid panel; Future  Healthy adult on routine physical examination -     Comprehensive metabolic panel; Future -     CBC with Differential/Platelet; Future  Postmenopausal state -  DG Bone Density; Future   Normal physical exam findings today, handouts given on healthy exercise. Ordered surveillance labs  and needs new DEXA scan. We also discussed what vaccinations she will be needing (2nd pneumonia and RSV) pt states she will get these at the pharmacy.  No follow-ups on file.     Karie Georges, MD

## 2022-06-22 NOTE — Patient Instructions (Addendum)
You need the Prevnar 20 vaccination-- you can make an appointment as a nursing visit  RSV vaccine-- available to any pharmacy.  Make lab appointment for the blood work at your convenience

## 2022-06-25 ENCOUNTER — Other Ambulatory Visit: Payer: Medicare Other

## 2022-06-27 ENCOUNTER — Other Ambulatory Visit (INDEPENDENT_AMBULATORY_CARE_PROVIDER_SITE_OTHER): Payer: Medicare Other

## 2022-06-27 ENCOUNTER — Ambulatory Visit (INDEPENDENT_AMBULATORY_CARE_PROVIDER_SITE_OTHER): Payer: Medicare Other | Admitting: *Deleted

## 2022-06-27 DIAGNOSIS — Z23 Encounter for immunization: Secondary | ICD-10-CM | POA: Diagnosis not present

## 2022-06-27 DIAGNOSIS — Z0000 Encounter for general adult medical examination without abnormal findings: Secondary | ICD-10-CM | POA: Diagnosis not present

## 2022-06-27 DIAGNOSIS — Z13220 Encounter for screening for lipoid disorders: Secondary | ICD-10-CM

## 2022-06-27 LAB — CBC WITH DIFFERENTIAL/PLATELET
Basophils Absolute: 0 10*3/uL (ref 0.0–0.1)
Basophils Relative: 0.8 % (ref 0.0–3.0)
Eosinophils Absolute: 0.2 10*3/uL (ref 0.0–0.7)
Eosinophils Relative: 3.1 % (ref 0.0–5.0)
HCT: 42.1 % (ref 36.0–46.0)
Hemoglobin: 14.2 g/dL (ref 12.0–15.0)
Lymphocytes Relative: 35.2 % (ref 12.0–46.0)
Lymphs Abs: 1.7 10*3/uL (ref 0.7–4.0)
MCHC: 33.8 g/dL (ref 30.0–36.0)
MCV: 96.2 fl (ref 78.0–100.0)
Monocytes Absolute: 0.5 10*3/uL (ref 0.1–1.0)
Monocytes Relative: 11.2 % (ref 3.0–12.0)
Neutro Abs: 2.4 10*3/uL (ref 1.4–7.7)
Neutrophils Relative %: 49.7 % (ref 43.0–77.0)
Platelets: 206 10*3/uL (ref 150.0–400.0)
RBC: 4.37 Mil/uL (ref 3.87–5.11)
RDW: 13.4 % (ref 11.5–15.5)
WBC: 4.9 10*3/uL (ref 4.0–10.5)

## 2022-06-27 LAB — COMPREHENSIVE METABOLIC PANEL
ALT: 14 U/L (ref 0–35)
AST: 18 U/L (ref 0–37)
Albumin: 4.2 g/dL (ref 3.5–5.2)
Alkaline Phosphatase: 66 U/L (ref 39–117)
BUN: 13 mg/dL (ref 6–23)
CO2: 27 mEq/L (ref 19–32)
Calcium: 9.3 mg/dL (ref 8.4–10.5)
Chloride: 104 mEq/L (ref 96–112)
Creatinine, Ser: 0.82 mg/dL (ref 0.40–1.20)
GFR: 73.3 mL/min (ref >60.00)
Glucose, Bld: 79 mg/dL (ref 70–99)
Potassium: 4 mEq/L (ref 3.5–5.1)
Sodium: 141 mEq/L (ref 135–145)
Total Bilirubin: 0.6 mg/dL (ref 0.2–1.2)
Total Protein: 7.1 g/dL (ref 6.0–8.3)

## 2022-06-27 LAB — LIPID PANEL
Cholesterol: 193 mg/dL (ref 0–200)
HDL: 74.1 mg/dL (ref >39.00)
LDL Cholesterol: 97 mg/dL (ref 0–99)
NonHDL: 118.68
Total CHOL/HDL Ratio: 3
Triglycerides: 109 mg/dL (ref 0.0–149.0)
VLDL: 21.8 mg/dL (ref 0.0–40.0)

## 2022-06-29 ENCOUNTER — Encounter: Payer: Self-pay | Admitting: Family Medicine

## 2022-07-02 DIAGNOSIS — N898 Other specified noninflammatory disorders of vagina: Secondary | ICD-10-CM | POA: Diagnosis not present

## 2022-07-02 DIAGNOSIS — N952 Postmenopausal atrophic vaginitis: Secondary | ICD-10-CM | POA: Diagnosis not present

## 2022-07-02 DIAGNOSIS — Z124 Encounter for screening for malignant neoplasm of cervix: Secondary | ICD-10-CM | POA: Diagnosis not present

## 2022-07-03 ENCOUNTER — Encounter: Payer: Self-pay | Admitting: Family Medicine

## 2022-07-03 ENCOUNTER — Other Ambulatory Visit: Payer: Self-pay | Admitting: Family Medicine

## 2022-07-03 DIAGNOSIS — Z1231 Encounter for screening mammogram for malignant neoplasm of breast: Secondary | ICD-10-CM

## 2022-07-04 ENCOUNTER — Ambulatory Visit
Admission: RE | Admit: 2022-07-04 | Discharge: 2022-07-04 | Disposition: A | Discharge status: Home / Self Care | Payer: Medicare Other | Source: Ambulatory Visit | Attending: Family Medicine | Admitting: Family Medicine

## 2022-07-04 DIAGNOSIS — E559 Vitamin D deficiency, unspecified: Secondary | ICD-10-CM | POA: Diagnosis not present

## 2022-07-04 DIAGNOSIS — Z8262 Family history of osteoporosis: Secondary | ICD-10-CM | POA: Diagnosis not present

## 2022-07-04 DIAGNOSIS — Z780 Asymptomatic menopausal state: Secondary | ICD-10-CM

## 2022-07-04 DIAGNOSIS — Z90722 Acquired absence of ovaries, bilateral: Secondary | ICD-10-CM | POA: Diagnosis not present

## 2022-07-04 DIAGNOSIS — E2839 Other primary ovarian failure: Secondary | ICD-10-CM | POA: Diagnosis not present

## 2022-07-09 DIAGNOSIS — H04123 Dry eye syndrome of bilateral lacrimal glands: Secondary | ICD-10-CM | POA: Diagnosis not present

## 2022-07-09 DIAGNOSIS — H2513 Age-related nuclear cataract, bilateral: Secondary | ICD-10-CM | POA: Diagnosis not present

## 2022-07-09 DIAGNOSIS — H40013 Open angle with borderline findings, low risk, bilateral: Secondary | ICD-10-CM | POA: Diagnosis not present

## 2022-07-09 DIAGNOSIS — H18513 Endothelial corneal dystrophy, bilateral: Secondary | ICD-10-CM | POA: Diagnosis not present

## 2022-08-14 ENCOUNTER — Encounter: Payer: Self-pay | Admitting: Gastroenterology

## 2022-08-14 ENCOUNTER — Ambulatory Visit: Payer: Medicare Other | Admitting: Gastroenterology

## 2022-08-14 VITALS — BP 100/68 | HR 76 | Ht 64.75 in | Wt 137.2 lb

## 2022-08-14 DIAGNOSIS — Z86010 Personal history of colonic polyps: Secondary | ICD-10-CM

## 2022-08-14 DIAGNOSIS — K2100 Gastro-esophageal reflux disease with esophagitis, without bleeding: Secondary | ICD-10-CM

## 2022-08-14 MED ORDER — FAMOTIDINE 40 MG PO TABS: 40.0000 mg | ORAL_TABLET | Freq: Two times a day (BID) | ORAL | 11 refills | Status: AC

## 2022-08-14 MED ORDER — NA SULFATE-K SULFATE-MG SULF 17.5-3.13-1.6 GM/177ML PO SOLN: 1.0000 | Freq: Once | ORAL | 0 refills | Status: AC

## 2022-08-14 NOTE — Progress Notes (Signed)
Assessment    Personal history of multiple adenomatous colon polyps, due for surveillance colonoscopy GERD with LA Grade A esophagitis  Mild CIC   Recommendations   Schedule colonoscopy. The risks (including bleeding, perforation, infection, missed lesions, medication reactions and possible hospitalization or surgery if complications occur), benefits, and alternatives to colonoscopy with possible biopsy and possible polypectomy were discussed with the patient and they consent to proceed.   Resume famotidine 40 mg po bid, follow antireflux measures.  Reduce daily to 40 mg daily after 1 month if symptoms controlled Miralax every day prn    HPI   Chief complaint: personal history of colon polyps, GERD  Patient profile:  Erica Kramer is a 67 y.o. female self referred for a personal history of colon polyps, GERD with LA Grade A esophagitis.  She relates more frequent reflux symptoms for the past several weeks She is taking famotidine 10 mg daily as needed for symptom control which has been partially effective. Previously treated with famotidine 40 mg po bid with very good symptom control. Mild constipation controlled with Miralax. Denies weight loss, abdominal pain, diarrhea, change in stool caliber, melena, hematochezia, nausea, vomiting, dysphagia, reflux chest pain.    Previous Labs / Imaging::    Latest Ref Rng & Units 06/27/2022    8:20 AM 10/07/2020    1:39 AM 09/19/2020    7:43 AM  CBC  WBC 4.0 - 10.5 K/uL 4.9  9.9  4.0   Hemoglobin 12.0 - 15.0 g/dL 16.1  09.6  04.5   Hematocrit 36.0 - 46.0 % 42.1  41.0  41.3   Platelets 150.0 - 400.0 K/uL 206.0  190  186.0     Lab Results  Component Value Date   LIPASE 32.0 03/12/2011      Latest Ref Rng & Units 06/27/2022    8:20 AM 10/07/2020    1:39 AM 09/19/2020    7:43 AM  CMP  Glucose 70 - 99 mg/dL 79  409  82   BUN 6 - 23 mg/dL 13  17  9    Creatinine 0.40 - 1.20 mg/dL 8.11  9.14  7.82   Sodium 135 - 145 mEq/L 141  139  140    Potassium 3.5 - 5.1 mEq/L 4.0  3.9  3.9   Chloride 96 - 112 mEq/L 104  103  103   CO2 19 - 32 mEq/L 27  27  29    Calcium 8.4 - 10.5 mg/dL 9.3  9.6  9.2   Total Protein 6.0 - 8.3 g/dL 7.1   6.7   Total Bilirubin 0.2 - 1.2 mg/dL 0.6   0.5   Alkaline Phos 39 - 117 U/L 66   70   AST 0 - 37 U/L 18   18   ALT 0 - 35 U/L 14   19      Previous GI evaluation    Endoscopies:  Colonoscopy Mar 2021 - Five 7 to 9 mm polyps in the sigmoid colon, in the descending colon and in the transverse colon, removed with a cold snare. Resected and retrieved.  - Mild diverticulosis in the left colon.  - Internal hemorrhoids.  - The examination was otherwise normal on direct and retroflexion views Path: 5 TAs  EGD Mar 2021 - LA Grade A reflux esophagitis with no bleeding.  - Ectopic gastric mucosa, proximal esophagus.  - Z-line variable, 35 cm from the incisors. Biopsied.  - A few gastric polyps. Biopsied.  - Normal duodenal bulb  and second portion of the duodenum.  Imaging:     Past Medical History:  Diagnosis Date   Allergy    mild   Anemia    as child only   Anxiety    no medicines   Chronic frontal sinusitis 01/28/2007   Qualifier: Diagnosis of  By: Lovell Sheehan MD, John E    COLONIC POLYPS, ADENOMATOUS, HX OF 08/30/2008   Qualifier: Diagnosis of  By: Candice Camp CMA (AAMA), Dottie     Constipation    diet controlled mainly   Diverticulosis    DJD (degenerative joint disease)    GERD 09/02/2008   Qualifier: Diagnosis of  By: Russella Dar MD Bronson Curb T    Glaucoma    Headache(784.0)    HEMATOCHEZIA 09/02/2008   Qualifier: Diagnosis of  By: Russella Dar MD Bronson Curb T    Macular degeneration syndrome    MONILIASIS, SKIN/NAILS 01/28/2007   Qualifier: Diagnosis of  By: Lovell Sheehan MD, John E    Neck pain, chronic    Osteopenia    PONV (postoperative nausea and vomiting)    Rosacea    TMJ (temporomandibular joint disorder)    Past Surgical History:  Procedure Laterality Date   ABDOMINAL  HYSTERECTOMY     COLONOSCOPY  04/22/2019   LAPAROSCOPIC APPENDECTOMY  03/12/2011   Procedure: APPENDECTOMY LAPAROSCOPIC;  Surgeon: Caleen Essex III, MD;  Location: [facility];  Service: General;  Laterality: N/A;   POLYPECTOMY     TONSILLECTOMY     UPPER GASTROINTESTINAL ENDOSCOPY  04/22/2019   Family History  Problem Relation Age of Onset   Breast cancer Mother    Colon polyps Father    Barrett's esophagus Father    Heart disease Other    Cancer Other    Colon cancer Other    Diabetes Other    Breast cancer Maternal Aunt    Rectal cancer Neg Hx    Stomach cancer Neg Hx    Esophageal cancer Neg Hx    Social History   Tobacco Use   Smoking status: Never   Smokeless tobacco: Never  Vaping Use   Vaping status: Never Used  Substance Use Topics   Alcohol use: Yes    Comment: occasional wine-2 to 3 glasses per year   Drug use: No   Current Outpatient Medications  Medication Sig Dispense Refill   calcium-vitamin D (OSCAL WITH D) 500-200 MG-UNIT per tablet Take 1 tablet by mouth daily. Takes on and off     cholecalciferol (VITAMIN D3) 25 MCG (1000 UNIT) tablet Take 1,000 Units by mouth daily.     fluticasone (FLONASE) 50 MCG/ACT nasal spray Place into both nostrils daily.     influenza vac split quadrivalent PF (FLUARIX) 0.5 ML injection Inject into the muscle. 0.5 mL 0   influenza vaccine adjuvanted (FLUAD) 0.5 ML injection Inject into the muscle. 0.5 mL 0   latanoprost (XALATAN) 0.005 % ophthalmic solution Place 1 drop into both eyes at bedtime.       PRESCRIPTION MEDICATION Estradiol vaginal cream 0.02%-1-2 times weekly     valACYclovir (VALTREX) 500 MG tablet Take 1 tablet (500 mg total) by mouth daily. (Patient taking differently: Take 500 mg by mouth as needed.) 90 tablet 1   No current facility-administered medications for this visit.   No Known Allergies  Review of Systems: All other systems reviewed and negative except where noted in HPI.    Physical Exam    Wt  Readings from Last 3 Encounters:  08/14/22 137 lb  3.2 oz (62.2 kg)  06/22/22 138 lb 3.2 oz (62.7 kg)  03/20/21 137 lb 9.6 oz (62.4 kg)    BP 100/68   Pulse 76   Ht 5' 4.75" (1.645 m)   Wt 137 lb 3.2 oz (62.2 kg)   BMI 23.01 kg/m  Constitutional:  Generally well appearing female in no acute distress. HEENT: Pupils normal.  Conjunctivae are normal. No scleral icterus. No oral lesions or deformities noted.  Neck: Supple.  Cardiac: Normal rate, regular rhythm without murmurs. Pulmonary/chest: Effort normal and breath sounds normal. No wheezing, rales or rhonchi. Abdominal: Soft, nondistended, nontender. Active bowel sounds. No palpable HSM, masses or hernias. Rectal: Deferred to colonoscopy Extremities: No edema or deformities noted Neurological: Alert and oriented to person, place and time. Psychiatric: Pleasant. Normal mood and affect. Behavior is normal. Skin: Skin is warm and dry. No rashes noted.  Claudette Head, MD   cc:  Referring Provider No ref. provider found

## 2022-08-14 NOTE — Patient Instructions (Signed)
We have sent the following medications to your pharmacy for you to pick up at your convenience: famotidine and Suprep.  You have been scheduled for a colonoscopy. Please follow written instructions given to you at your visit today.   Please pick up your prep supplies at the pharmacy within the next 1-3 days.  If you use inhalers (even only as needed), please bring them with you on the day of your procedure.  DO NOT TAKE 7 DAYS PRIOR TO TEST- Trulicity (dulaglutide) Ozempic, Wegovy (semaglutide) Mounjaro (tirzepatide) Bydureon Bcise (exanatide extended release)  DO NOT TAKE 1 DAY PRIOR TO YOUR TEST Rybelsus (semaglutide) Adlyxin (lixisenatide) Victoza (liraglutide) Byetta (exanatide) ___________________________________________________________________________   If your blood pressure at your visit was 140/90 or greater, please contact your primary care physician to follow up on this.  _______________________________________________________  If you are age 25 or older, your body mass index should be between 23-30. Your Body mass index is 23.01 kg/m. If this is out of the aforementioned range listed, please consider follow up with your Primary Care Provider.  If you are age 70 or younger, your body mass index should be between 19-25. Your Body mass index is 23.01 kg/m. If this is out of the aformentioned range listed, please consider follow up with your Primary Care Provider.   ________________________________________________________  The [facility] providers would like to encourage you to use [facility] to communicate with providers for non-urgent requests or questions.  Due to long hold times on the telephone, sending your provider a message by [facility] may be a faster and more efficient way to get a response.  Please allow 48 business hours for a response.  Please remember that this is for non-urgent requests.  _______________________________________________________

## 2022-08-16 ENCOUNTER — Encounter: Payer: Self-pay | Admitting: Certified Registered Nurse Anesthetist

## 2022-08-17 ENCOUNTER — Encounter: Payer: Self-pay | Admitting: Gastroenterology

## 2022-08-17 ENCOUNTER — Ambulatory Visit (AMBULATORY_SURGERY_CENTER): Payer: Medicare Other | Admitting: Gastroenterology

## 2022-08-17 VITALS — BP 115/76 | HR 66 | Temp 98.0°F | Resp 16 | Ht 64.0 in | Wt 137.0 lb

## 2022-08-17 DIAGNOSIS — K621 Rectal polyp: Secondary | ICD-10-CM | POA: Diagnosis not present

## 2022-08-17 DIAGNOSIS — Z86010 Personal history of colonic polyps: Secondary | ICD-10-CM | POA: Diagnosis not present

## 2022-08-17 DIAGNOSIS — D124 Benign neoplasm of descending colon: Secondary | ICD-10-CM | POA: Diagnosis not present

## 2022-08-17 DIAGNOSIS — Z09 Encounter for follow-up examination after completed treatment for conditions other than malignant neoplasm: Secondary | ICD-10-CM | POA: Diagnosis not present

## 2022-08-17 DIAGNOSIS — D128 Benign neoplasm of rectum: Secondary | ICD-10-CM

## 2022-08-17 DIAGNOSIS — K635 Polyp of colon: Secondary | ICD-10-CM | POA: Diagnosis not present

## 2022-08-17 MED ORDER — SODIUM CHLORIDE 0.9 % IV SOLN: 500.0000 mL | INTRAVENOUS | Status: DC

## 2022-08-17 NOTE — Patient Instructions (Signed)
Handouts provided about diverticulosis, high fiber diet, polyps and hemorrhoids.  High fiber diet.  Continue present medications.  Await pathology results  YOU HAD AN ENDOSCOPIC PROCEDURE TODAY AT THE [facility]:   Refer to the procedure report that was given to you for any specific questions about what was found during the examination.  If the procedure report does not answer your questions, please call your gastroenterologist to clarify.  If you requested that your care partner not be given the details of your procedure findings, then the procedure report has been included in a sealed envelope for you to review at your convenience later.  YOU SHOULD EXPECT: Some feelings of bloating in the abdomen. Passage of more gas than usual.  Walking can help get rid of the air that was put into your GI tract during the procedure and reduce the bloating. If you had a lower endoscopy (such as a colonoscopy or flexible sigmoidoscopy) you may notice spotting of blood in your stool or on the toilet paper. If you underwent a bowel prep for your procedure, you may not have a normal bowel movement for a few days.  Please Note:  You might notice some irritation and congestion in your nose or some drainage.  This is from the oxygen used during your procedure.  There is no need for concern and it should clear up in a day or so.  SYMPTOMS TO REPORT IMMEDIATELY:  Following lower endoscopy (colonoscopy or flexible sigmoidoscopy):  Excessive amounts of blood in the stool  Significant tenderness or worsening of abdominal pains  Swelling of the abdomen that is new, acute  Fever of 100F or higher   For urgent or emergent issues, a gastroenterologist can be reached at any hour by calling [phone]. Do not use MyChart messaging for urgent concerns.    DIET:  We do recommend a small meal at first, but then you may proceed to your regular diet.  Drink plenty of fluids but you should avoid alcoholic  beverages for 24 hours.  ACTIVITY:  You should plan to take it easy for the rest of today and you should NOT DRIVE or use heavy machinery until tomorrow (because of the sedation medicines used during the test).    FOLLOW UP: Our staff will call the number listed on your records the next business day following your procedure.  We will call around 7:15- 8:00 am to check on you and address any questions or concerns that you may have regarding the information given to you following your procedure. If we do not reach you, we will leave a message.     If any biopsies were taken you will be contacted by phone or by letter within the next 1-3 weeks.  Please call us at [phone] if you have not heard about the biopsies in 3 weeks.    SIGNATURES/CONFIDENTIALITY: You and/or your care partner have signed paperwork which will be entered into your electronic medical record.  These signatures attest to the fact that that the information above on your After Visit Summary has been reviewed and is understood.  Full responsibility of the confidentiality of this discharge information lies with you and/or your care-partner.

## 2022-08-17 NOTE — Progress Notes (Signed)
See 08/14/2022 H&P no changes

## 2022-08-17 NOTE — Op Note (Signed)
[facility] Patient Name: Erica Kramer Procedure Date: 08/17/2022 9:57 AM MRN: [id number] Endoscopist: Meryl Dare , MD, [id number] Age: 67 Referring MD:  Date of Birth: November 18, 1955 Gender: Female Account #: [id number] Procedure:                Colonoscopy Indications:              Surveillance: Personal history of multiple                            adenomatous polyps on last colonoscopy 3 years ago Medicines:                Monitored Anesthesia Care Procedure:                Pre-Anesthesia Assessment:                           - Prior to the procedure, a History and Physical                            was performed, and patient medications and                            allergies were reviewed. The patient's tolerance of                            previous anesthesia was also reviewed. The risks                            and benefits of the procedure and the sedation                            options and risks were discussed with the patient.                            All questions were answered, and informed consent                            was obtained. Prior Anticoagulants: The patient has                            taken no anticoagulant or antiplatelet agents. ASA                            Grade Assessment: II - A patient with mild systemic                            disease. After reviewing the risks and benefits,                            the patient was deemed in satisfactory condition to                            undergo the procedure.  After obtaining informed consent, the colonoscope                            was passed under direct vision. Throughout the                            procedure, the patient's blood pressure, pulse, and                            oxygen saturations were monitored continuously. The                            Olympus CF-HQ190L [id number]) Colonoscope was                            introduced through  the anus and advanced to the the                            cecum, identified by appendiceal orifice and                            ileocecal valve. The ileocecal valve, appendiceal                            orifice, and rectum were photographed. The quality                            of the bowel preparation was good. The colonoscopy                            was performed without difficulty. The patient                            tolerated the procedure well. Scope In: 10:09:55 AM Scope Out: 10:27:38 AM Scope Withdrawal Time: 0 hours 14 minutes 37 seconds  Total Procedure Duration: 0 hours 17 minutes 43 seconds  Findings:                 The perianal and digital rectal examinations were                            normal.                           A 7 mm polyp was found in the descending colon. The                            polyp was sessile. The polyp was removed with a                            cold snare. Resection and retrieval were complete.                           A 4 mm polyp was found in the rectum. The polyp was  sessile. The polyp was removed with a cold biopsy                            forceps. Resection and retrieval were complete.                           A few small-mouthed diverticula were found in the                            left colon.                           Internal hemorrhoids were found during                            retroflexion. The hemorrhoids were small and Grade                            I (internal hemorrhoids that do not prolapse).                           The exam was otherwise without abnormality on                            direct and retroflexion views. Complications:            No immediate complications. Estimated blood loss:                            None. Estimated Blood Loss:     Estimated blood loss: none. Impression:               - One 7 mm polyp in the descending colon, removed                             with a cold snare. Resected and retrieved.                           - One 4 mm polyp in the rectum, removed with a cold                            biopsy forceps. Resected and retrieved.                           - Mild diverticulosis in the left colon.                           - Internal hemorrhoids.                           - The examination was otherwise normal on direct                            and retroflexion views. Recommendation:           - Repeat colonoscopy after studies are complete for  surveillance based on pathology results.                           - Patient has a contact number available for                            emergencies. The signs and symptoms of potential                            delayed complications were discussed with the                            patient. Return to normal activities tomorrow.                            Written discharge instructions were provided to the                            patient.                           - High fiber diet.                           - Continue present medications.                           - Await pathology results. Meryl Dare, MD 08/17/2022 10:35:07 AM This report has been signed electronically.

## 2022-08-17 NOTE — Progress Notes (Signed)
Called to room to assist during endoscopic procedure.  Patient ID and intended procedure confirmed with present staff. Received instructions for my participation in the procedure from the performing physician.  

## 2022-08-17 NOTE — Progress Notes (Signed)
Report given to PACU, vss 

## 2022-08-20 ENCOUNTER — Telehealth: Payer: Self-pay

## 2022-08-20 NOTE — Telephone Encounter (Signed)
  Follow up Call-     08/17/2022    9:28 AM  Call back number  Post procedure Call Back phone  # [phone]  Permission to leave phone message Yes     Patient questions:  Do you have a fever, pain , or abdominal swelling? No. Pain Score  0 *  Have you tolerated food without any problems? Yes.    Have you been able to return to your normal activities? Yes.    Do you have any questions about your discharge instructions: Diet   No. Medications  No. Follow up visit  No.  Do you have questions or concerns about your Care? No.  Actions: * If pain score is 4 or above: No action needed, pain <4.

## 2022-08-22 ENCOUNTER — Ambulatory Visit: Payer: Medicare Other

## 2022-08-22 VITALS — Ht 64.75 in | Wt 135.0 lb

## 2022-08-22 DIAGNOSIS — Z0000 Encounter for general adult medical examination without abnormal findings: Secondary | ICD-10-CM | POA: Diagnosis not present

## 2022-08-22 NOTE — Progress Notes (Signed)
Subjective:   Erica Kramer is a 67 y.o. female who presents for Medicare Annual (Subsequent) preventive examination.  Visit Complete: Virtual  I connected with  Erica Kramer on 08/22/22 by a audio enabled telemedicine application and verified that I am speaking with the correct person using two identifiers.  Patient Location: Home  Provider Location: Home Office  I discussed the limitations of evaluation and management by telemedicine. The patient expressed understanding and agreed to proceed.  Patient Medicare AWV questionnaire was completed by the patient on 08/22/22; I have confirmed that all information answered by patient is correct and no changes since this date.  Review of Systems    Vital Signs: Unable to obtain new vitals due to this being a telehealth visit.  Cardiac Risk Factors include: advanced age (>35men, >59 women)     Objective:    Today's Vitals   08/22/22 1350  Weight: 135 lb (61.2 kg)  Height: 5' 4.75" (1.645 m)   Body mass index is 22.64 kg/m.     08/22/2022    2:01 PM 10/07/2020   12:59 AM 05/18/2016    1:44 PM 03/12/2011   10:10 PM 03/12/2011    7:09 PM  Advanced Directives  Does Patient Have a Medical Advance Directive? Yes No Yes Patient does not have advance directive Patient has advance directive, copy not in chart  Type of Advance Directive Healthcare Power of [place];Living will  Healthcare Power of [place];Living will  Healthcare Power of [place];Living will  Copy of Healthcare Power of Attorney in Chart? No - copy requested      Would patient like information on creating a medical advance directive?  No - Patient declined     Pre-existing out of facility DNR order (yellow form or pink MOST form)    No     Current Medications (verified) Outpatient Encounter Medications as of 08/22/2022  Medication Sig   calcium-vitamin D (OSCAL WITH D) 500-200 MG-UNIT per tablet Take 1 tablet by mouth daily. Takes on and off   cholecalciferol  (VITAMIN D3) 25 MCG (1000 UNIT) tablet Take 1,000 Units by mouth 3 (three) times a week.   famotidine (PEPCID) 40 MG tablet Take 1 tablet (40 mg total) by mouth 2 (two) times daily.   fluticasone (FLONASE) 50 MCG/ACT nasal spray Place 1 spray into both nostrils daily as needed for allergies.   influenza vac split quadrivalent PF (FLUARIX) 0.5 ML injection Inject into the muscle.   influenza vaccine adjuvanted (FLUAD) 0.5 ML injection Inject into the muscle.   latanoprost (XALATAN) 0.005 % ophthalmic solution Place 1 drop into both eyes at bedtime.     loratadine (CLARITIN) 10 MG tablet Take 10 mg by mouth daily as needed for allergies.   PRESCRIPTION MEDICATION Estradiol vaginal cream 0.02%-1-2 times weekly   valACYclovir (VALTREX) 500 MG tablet Take 1 tablet (500 mg total) by mouth daily. (Patient taking differently: Take 500 mg by mouth as needed.)   No facility-administered encounter medications on file as of 08/22/2022.    Allergies (verified) Patient has no known allergies.   History: Past Medical History:  Diagnosis Date   Allergy    mild   Anemia    as child only   Anxiety    no medicines   Chronic frontal sinusitis 01/28/2007   Qualifier: Diagnosis of  By: Lovell Sheehan MD, John E    COLONIC POLYPS, ADENOMATOUS, HX OF 08/30/2008   Qualifier: Diagnosis of  By: Candice Camp CMA (AAMA), Dottie  Constipation    diet controlled mainly   Diverticulosis    DJD (degenerative joint disease)    GERD 09/02/2008   Qualifier: Diagnosis of  By: Russella Dar MD Bronson Curb T    Glaucoma    Headache(784.0)    HEMATOCHEZIA 09/02/2008   Qualifier: Diagnosis of  By: Russella Dar MD Bronson Curb T    Macular degeneration syndrome    MONILIASIS, SKIN/NAILS 01/28/2007   Qualifier: Diagnosis of  By: Lovell Sheehan MD, John E    Neck pain, chronic    Osteopenia    PONV (postoperative nausea and vomiting)    Rosacea    TMJ (temporomandibular joint disorder)    Past Surgical History:  Procedure Laterality  Date   ABDOMINAL HYSTERECTOMY     COLONOSCOPY  04/22/2019   LAPAROSCOPIC APPENDECTOMY  03/12/2011   Procedure: APPENDECTOMY LAPAROSCOPIC;  Surgeon: Robyne Askew, MD;  Location: [facility];  Service: General;  Laterality: N/A;   POLYPECTOMY     TONSILLECTOMY     UPPER GASTROINTESTINAL ENDOSCOPY  04/22/2019   Family History  Problem Relation Age of Onset   Breast cancer Mother    Colon polyps Father    Barrett's esophagus Father    Heart disease Other    Cancer Other    Colon cancer Other    Diabetes Other    Breast cancer Maternal Aunt    Rectal cancer Neg Hx    Stomach cancer Neg Hx    Esophageal cancer Neg Hx    Social History   Socioeconomic History   Marital status: Married    Spouse name: Not on file   Number of children: Not on file   Years of education: Not on file   Highest education level: Not on file  Occupational History   Not on file  Tobacco Use   Smoking status: Never   Smokeless tobacco: Never  Vaping Use   Vaping status: Never Used  Substance and Sexual Activity   Alcohol use: Yes    Comment: occasional wine-2 to 3 glasses per year   Drug use: No   Sexual activity: Yes  Other Topics Concern   Not on file  Social History Narrative   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, 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 [employer] Situation: lives with husband - kids are 99 and 16      Spiritual Beliefs: Christian and Media planner      Lifestyle: not regular exercise 2 times per week; diet is ok      Social Determinants of Corporate investment banker Strain: Low Risk  (08/22/2022)   Overall Financial Resource Strain (CARDIA)    Difficulty of Paying Living Expenses: Not hard at all  Food Insecurity: No Food Insecurity (08/22/2022)   Hunger Vital Sign    Worried About Running Out of Food in the Last Year: Never true    Ran Out of Food in the Last Year: Never true  Transportation Needs: No Transportation Needs (08/22/2022)   PRAPARE -  Administrator, Civil Service (Medical): No    Lack of Transportation (Non-Medical): No  Physical Activity: Insufficiently Active (08/22/2022)   Exercise Vital Sign    Days of Exercise per Week: 4 days    Minutes of Exercise per Session: 30 min  Stress: No Stress Concern Present (08/22/2022)   [employer] of Occupational Health - Occupational Stress Questionnaire    Feeling of Stress : Not at all  Social  Connections: Socially Integrated (08/22/2022)   Social Connection and Isolation Panel [NHANES]    Frequency of Communication with Friends and Family: More than three times a week    Frequency of Social Gatherings with Friends and Family: More than three times a week    Attends Religious Services: More than 4 times per year    Active Member of [employer] or Organizations: Yes    Attends Engineer, structural: More than 4 times per year    Marital Status: Married    Tobacco Counseling Counseling given: Not Answered   Clinical Intake:  Pre-visit preparation completed: Yes  Pain : No/denies pain     BMI - recorded: 22.64 Nutritional Status: BMI of 19-24  Normal Nutritional Risks: None Diabetes: No  How often do you need to have someone help you when you read instructions, pamphlets, or other written materials from your doctor or pharmacy?: 1 - Never  Interpreter Needed?: No  Information entered by :: Theresa Mulligan LPN   Activities of Daily Living    08/22/2022    1:58 PM 08/22/2022   10:47 AM  In your present state of health, do you have any difficulty performing the following activities:  Hearing? 0 0  Vision? 0 0  Difficulty concentrating or making decisions? 0 0  Walking or climbing stairs? 0 0  Dressing or bathing? 0 0  Doing errands, shopping? 0 0  Preparing Food and eating ? N N  Using the Toilet? N N  In the past six months, have you accidently leaked urine? N N  Do you have problems with loss of bowel control? N N  Managing your  Medications? N N  Managing your Finances? N N  Housekeeping or managing your Housekeeping? N N    Patient Care Team: Karie Georges, MD as PCP - General (Family Medicine) Obgyn, Mancel Bale, MD as Referring Physician (Dermatology)  Indicate any recent Medical Services you may have received from other than [facility] providers in the past year (date may be approximate).     Assessment:   This is a routine wellness examination for [place]ia.  Hearing/Vision screen Hearing Screening - Comments:: Denies hearing difficulties   Vision Screening - Comments:: Wears rx glasses - up to date with routine eye exams with  [facility].  Dietary issues and exercise activities discussed:     Goals Addressed               This Visit's Progress     Increase physical activity (pt-stated)        Lose 7 or 8 pounds. Eat less carbs. Change diet.       Depression Screen    08/22/2022    1:55 PM 06/22/2022   10:58 AM 09/16/2020    2:19 PM 10/29/2018    8:57 AM 11/14/2017    1:04 PM  PHQ 2/9 Scores  PHQ - 2 Score 0 0 0 0 0  PHQ- 9 Score   1      Fall Risk    08/22/2022    1:58 PM 08/22/2022   10:47 AM 06/22/2022   10:58 AM 09/16/2020    1:00 PM 10/29/2018    8:56 AM  Fall Risk   Falls in the past year? 0 0 0 0 0  Number falls in past yr: 0  0 0 0  Injury with Fall? 0  0  0  Risk for fall due to : No Fall Risks  No Fall Risks  Follow up Falls prevention discussed  Falls evaluation completed      MEDICARE RISK AT HOME:  Medicare Risk at Home - 08/22/22 1408     Any stairs in or around the home? Yes    If so, are there any without handrails? No    Home free of loose throw rugs in walkways, pet beds, electrical cords, etc? Yes    Adequate lighting in your home to reduce risk of falls? Yes    Life alert? No    Use of a cane, walker or w/c? No    Grab bars in the bathroom? No    Shower chair or bench in shower? No    Elevated toilet seat or a handicapped toilet? No              TIMED UP AND GO:  Was the test performed?  No    Cognitive Function:        08/22/2022    2:01 PM  6CIT Screen  What Year? 0 points  What month? 0 points  What time? 0 points  Count back from 20 0 points  Months in reverse 0 points  Repeat phrase 0 points  Total Score 0 points    Immunizations Immunization History  Administered Date(s) Administered   COVID-19, mRNA, vaccine(Comirnaty)12 years and older 11/13/2021   Fluad Quad(high Dose 65+) 10/29/2018, 11/22/2020   Influenza Nasal 10/30/2010   Influenza Split 11/13/2011   Influenza Whole 11/25/2008   Influenza,inj,Quad PF,6+ Mos 09/22/2012, 10/27/2013, 11/07/2015, 10/12/2016, 11/07/2017   Influenza-Unspecified 11/08/2014   PFIZER(Purple Top)SARS-COV-2 Vaccination 03/14/2019, 04/07/2019, 11/17/2019, 05/12/2020   PNEUMOCOCCAL CONJUGATE-20 06/27/2022   Pfizer Covid-19 Vaccine Bivalent Booster 32yrs & up 11/14/2020   Pneumococcal Polysaccharide-23 10/29/2018   Td 11/30/2008   Zoster Recombinant(Shingrix) 11/14/2017   Zoster, Live 10/19/2013    TDAP status: Due, Education has been provided regarding the importance of this vaccine. Advised may receive this vaccine at local pharmacy or Health Dept. Aware to provide a copy of the vaccination record if obtained from local pharmacy or Health Dept. Verbalized acceptance and understanding.  Flu Vaccine status: Up to date  Pneumococcal vaccine status: Up to date  Covid-19 vaccine status: Completed vaccines  Qualifies for Shingles Vaccine? Yes   Zostavax completed No   Shingrix Completed?: No.    Education has been provided regarding the importance of this vaccine. Patient has been advised to call insurance company to determine out of pocket expense if they have not yet received this vaccine. Advised may also receive vaccine at local pharmacy or Health Dept. Verbalized acceptance and understanding.  Screening Tests Health Maintenance  Topic Date Due    DTaP/Tdap/Td (2 - Tdap) 12/01/2018   COVID-19 Vaccine (7 - 2023-24 season) 03/16/2022   Zoster Vaccines- Shingrix (2 of 2) 09/22/2022 (Originally 01/09/2018)   INFLUENZA VACCINE  08/23/2022   Medicare Annual Wellness (AWV)  08/22/2023   MAMMOGRAM  09/07/2023   Colonoscopy  08/16/2025   Pneumonia Vaccine 24+ Years old  Completed   DEXA SCAN  Completed   Hepatitis C Screening  Completed   HPV VACCINES  Aged Out    Health Maintenance  Health Maintenance Due  Topic Date Due   DTaP/Tdap/Td (2 - Tdap) 12/01/2018   COVID-19 Vaccine (7 - 2023-24 season) 03/16/2022    Colorectal cancer screening: Type of screening: Colonoscopy. Completed 08/16/24. Repeat every 3 years  Mammogram status: Completed 09/10/22. Repeat every year  Bone Density status: Completed 07/04/22. Results reflect: Bone density results: OSTEOPOROSIS. Repeat  every   years.  Lung Cancer Screening: (Low Dose CT Chest recommended if Age 62-80 years, 20 pack-year currently smoking OR have quit w/in 15years.) does not qualify.     Additional Screening:  Hepatitis C Screening: does qualify; Completed 11/14/17  Vision Screening: Recommended annual ophthalmology exams for early detection of glaucoma and other disorders of the eye. Is the patient up to date with their annual eye exam?  Yes  Who is the provider or what is the name of the office in which the patient attends annual eye exams? [facility]. If pt is not established with a provider, would they like to be referred to a provider to establish care? No .   Dental Screening: Recommended annual dental exams for proper oral hygiene    Community Resource Referral / Chronic Care Management:  CRR required this visit?  No   CCM required this visit?  No     Plan:     I have personally reviewed and noted the following in the patient's chart:   Medical and social history Use of alcohol, tobacco or illicit drugs  Current medications and supplements including opioid  prescriptions. Patient is not currently taking opioid prescriptions. Functional ability and status Nutritional status Physical activity Advanced directives List of other physicians Hospitalizations, surgeries, and ER visits in previous 12 months Vitals Screenings to include cognitive, depression, and falls Referrals and appointments  In addition, I have reviewed and discussed with patient certain preventive protocols, quality metrics, and best practice recommendations. A written personalized care plan for preventive services as well as general preventive health recommendations were provided to patient.     Tillie Rung, LPN   1/61/0960   After Visit Summary: (MyChart) Due to this being a telephonic visit, the after visit summary with patients personalized plan was offered to patient via MyChart   Nurse Notes: None

## 2022-08-22 NOTE — Patient Instructions (Addendum)
Erica Kramer , Thank you for taking time to come for your Medicare Wellness Visit. I appreciate your ongoing commitment to your health goals. Please review the following plan we discussed and let me know if I can assist you in the future.   Referrals/Orders/Follow-Ups/Clinician Recommendations:   This is a list of the screening recommended for you and due dates:  Health Maintenance  Topic Date Due   DTaP/Tdap/Td vaccine (2 - Tdap) 12/01/2018   COVID-19 Vaccine (7 - 2023-24 season) 03/16/2022   Zoster (Shingles) Vaccine (2 of 2) 09/22/2022*   Flu Shot  08/23/2022   Medicare Annual Wellness Visit  08/22/2023   Mammogram  09/07/2023   Colon Cancer Screening  08/16/2025   Pneumonia Vaccine  Completed   DEXA scan (bone density measurement)  Completed   Hepatitis C Screening  Completed   HPV Vaccine  Aged Out  *Topic was postponed. The date shown is not the original due date.    Advanced directives: (Copy Requested) Please bring a copy of your health care power of attorney and living will to the office to be added to your chart at your convenience.  Next Medicare Annual Wellness Visit scheduled for next year: Yes  Preventive Care 69 Years and Older, Female Preventive care refers to lifestyle choices and visits with your health care provider that can promote health and wellness. What does preventive care include? A yearly physical exam. This is also called an annual well check. Dental exams once or twice a year. Routine eye exams. Ask your health care provider how often you should have your eyes checked. Personal lifestyle choices, including: Daily care of your teeth and gums. Regular physical activity. Eating a healthy diet. Avoiding tobacco and drug use. Limiting alcohol use. Practicing safe sex. Taking low-dose aspirin every day. Taking vitamin and mineral supplements as recommended by your health care provider. What happens during an annual well check? The services and screenings  done by your health care provider during your annual well check will depend on your age, overall health, lifestyle risk factors, and family history of disease. Counseling  Your health care provider may ask you questions about your: Alcohol use. Tobacco use. Drug use. Emotional well-being. Home and relationship well-being. Sexual activity. Eating habits. History of falls. Memory and ability to understand (cognition). Work and work Astronomer. Reproductive health. Screening  You may have the following tests or measurements: Height, weight, and BMI. Blood pressure. Lipid and cholesterol levels. These may be checked every 5 years, or more frequently if you are over 87 years old. Skin check. Lung cancer screening. You may have this screening every year starting at age 17 if you have a 30-pack-year history of smoking and currently smoke or have quit within the past 15 years. Fecal occult blood test (FOBT) of the stool. You may have this test every year starting at age 73. Flexible sigmoidoscopy or colonoscopy. You may have a sigmoidoscopy every 5 years or a colonoscopy every 10 years starting at age 36. Hepatitis C blood test. Hepatitis B blood test. Sexually transmitted disease (STD) testing. Diabetes screening. This is done by checking your blood sugar (glucose) after you have not eaten for a while (fasting). You may have this done every 1-3 years. Bone density scan. This is done to screen for osteoporosis. You may have this done starting at age 49. Mammogram. This may be done every 1-2 years. Talk to your health care provider about how often you should have regular mammograms. Talk with your health  care provider about your test results, treatment options, and if necessary, the need for more tests. Vaccines  Your health care provider may recommend certain vaccines, such as: Influenza vaccine. This is recommended every year. Tetanus, diphtheria, and acellular pertussis (Tdap, Td)  vaccine. You may need a Td booster every 10 years. Zoster vaccine. You may need this after age 31. Pneumococcal 13-valent conjugate (PCV13) vaccine. One dose is recommended after age 70. Pneumococcal polysaccharide (PPSV23) vaccine. One dose is recommended after age 37. Talk to your health care provider about which screenings and vaccines you need and how often you need them. This information is not intended to replace advice given to you by your health care provider. Make sure you discuss any questions you have with your health care provider. Document Released: 02/04/2015 Document Revised: 09/28/2015 Document Reviewed: 11/09/2014 Elsevier Interactive Patient Education  2017 [employer].  Fall Prevention in the Home Falls can cause injuries. They can happen to people of all ages. There are many things you can do to make your home safe and to help prevent falls. What can I do on the outside of my home? Regularly fix the edges of walkways and driveways and fix any cracks. Remove anything that might make you trip as you walk through a door, such as a raised step or threshold. Trim any bushes or trees on the path to your home. Use bright outdoor lighting. Clear any walking paths of anything that might make someone trip, such as rocks or tools. Regularly check to see if handrails are loose or broken. Make sure that both sides of any steps have handrails. Any raised decks and porches should have guardrails on the edges. Have any leaves, snow, or ice cleared regularly. Use sand or salt on walking paths during winter. Clean up any spills in your garage right away. This includes oil or grease spills. What can I do in the bathroom? Use night lights. Install grab bars by the toilet and in the tub and shower. Do not use towel bars as grab bars. Use non-skid mats or decals in the tub or shower. If you need to sit down in the shower, use a plastic, non-slip stool. Keep the floor dry. Clean up any  water that spills on the floor as soon as it happens. Remove soap buildup in the tub or shower regularly. Attach bath mats securely with double-sided non-slip rug tape. Do not have throw rugs and other things on the floor that can make you trip. What can I do in the bedroom? Use night lights. Make sure that you have a light by your bed that is easy to reach. Do not use any sheets or blankets that are too big for your bed. They should not hang down onto the floor. Have a firm chair that has side arms. You can use this for support while you get dressed. Do not have throw rugs and other things on the floor that can make you trip. What can I do in the kitchen? Clean up any spills right away. Avoid walking on wet floors. Keep items that you use a lot in easy-to-reach places. If you need to reach something above you, use a strong step stool that has a grab bar. Keep electrical cords out of the way. Do not use floor polish or wax that makes floors slippery. If you must use wax, use non-skid floor wax. Do not have throw rugs and other things on the floor that can make you trip. What can  I do with my stairs? Do not leave any items on the stairs. Make sure that there are handrails on both sides of the stairs and use them. Fix handrails that are broken or loose. Make sure that handrails are as long as the stairways. Check any carpeting to make sure that it is firmly attached to the stairs. Fix any carpet that is loose or worn. Avoid having throw rugs at the top or bottom of the stairs. If you do have throw rugs, attach them to the floor with carpet tape. Make sure that you have a light switch at the top of the stairs and the bottom of the stairs. If you do not have them, ask someone to add them for you. What else can I do to help prevent falls? Wear shoes that: Do not have high heels. Have rubber bottoms. Are comfortable and fit you well. Are closed at the toe. Do not wear sandals. If you use a  stepladder: Make sure that it is fully opened. Do not climb a closed stepladder. Make sure that both sides of the stepladder are locked into place. Ask someone to hold it for you, if possible. Clearly mark and make sure that you can see: Any grab bars or handrails. First and last steps. Where the edge of each step is. Use tools that help you move around (mobility aids) if they are needed. These include: Canes. Walkers. Scooters. Crutches. Turn on the lights when you go into a dark area. Replace any light bulbs as soon as they burn out. Set up your furniture so you have a clear path. Avoid moving your furniture around. If any of your floors are uneven, fix them. If there are any pets around you, be aware of where they are. Review your medicines with your doctor. Some medicines can make you feel dizzy. This can increase your chance of falling. Ask your doctor what other things that you can do to help prevent falls. This information is not intended to replace advice given to you by your health care provider. Make sure you discuss any questions you have with your health care provider. Document Released: 11/04/2008 Document Revised: 06/16/2015 Document Reviewed: 02/12/2014 Elsevier Interactive Patient Education  2017 [employer].

## 2022-08-31 ENCOUNTER — Encounter: Payer: Self-pay | Admitting: Gastroenterology

## 2022-09-06 DIAGNOSIS — K080 Exfoliation of teeth due to systemic causes: Secondary | ICD-10-CM | POA: Diagnosis not present

## 2022-09-10 ENCOUNTER — Ambulatory Visit
Admission: RE | Admit: 2022-09-10 | Discharge: 2022-09-10 | Disposition: A | Discharge status: Home / Self Care | Payer: Medicare Other | Source: Ambulatory Visit | Attending: Family Medicine | Admitting: Family Medicine

## 2022-09-10 DIAGNOSIS — Z1231 Encounter for screening mammogram for malignant neoplasm of breast: Secondary | ICD-10-CM

## 2022-10-03 ENCOUNTER — Other Ambulatory Visit (HOSPITAL_BASED_OUTPATIENT_CLINIC_OR_DEPARTMENT_OTHER): Payer: Self-pay

## 2022-10-03 MED ORDER — COMIRNATY 30 MCG/0.3ML IM SUSY
0.3000 mL | PREFILLED_SYRINGE | Freq: Once | INTRAMUSCULAR | 0 refills | Status: AC
Start: 2022-10-03 — End: 2022-10-04
  Filled 2022-10-03: qty 0.3, 1d supply, fill #0

## 2022-10-10 ENCOUNTER — Other Ambulatory Visit (HOSPITAL_BASED_OUTPATIENT_CLINIC_OR_DEPARTMENT_OTHER): Payer: Self-pay

## 2022-10-10 MED ORDER — INFLUENZA VAC A&B SURF ANT ADJ 0.5 ML IM SUSY
0.5000 mL | PREFILLED_SYRINGE | Freq: Once | INTRAMUSCULAR | 0 refills | Status: AC
  Filled 2022-10-10: qty 0.5, 1d supply, fill #0

## 2022-12-05 ENCOUNTER — Other Ambulatory Visit (HOSPITAL_BASED_OUTPATIENT_CLINIC_OR_DEPARTMENT_OTHER): Payer: Self-pay

## 2022-12-05 MED ORDER — BOOSTRIX 5-2.5-18.5 LF-MCG/0.5 IM SUSY
0.5000 mL | PREFILLED_SYRINGE | Freq: Once | INTRAMUSCULAR | 0 refills | Status: AC
  Filled 2022-12-05: qty 0.5, 1d supply, fill #0

## 2022-12-24 ENCOUNTER — Encounter (INDEPENDENT_AMBULATORY_CARE_PROVIDER_SITE_OTHER): Payer: Self-pay

## 2022-12-24 ENCOUNTER — Ambulatory Visit (INDEPENDENT_AMBULATORY_CARE_PROVIDER_SITE_OTHER): Payer: Medicare Other | Admitting: Otolaryngology

## 2022-12-24 VITALS — Ht 64.0 in | Wt 135.0 lb

## 2022-12-24 DIAGNOSIS — H903 Sensorineural hearing loss, bilateral: Secondary | ICD-10-CM

## 2022-12-24 DIAGNOSIS — H6122 Impacted cerumen, left ear: Secondary | ICD-10-CM

## 2022-12-24 DIAGNOSIS — B0221 Postherpetic geniculate ganglionitis: Secondary | ICD-10-CM

## 2022-12-24 NOTE — Progress Notes (Signed)
Patient ID: Erica Kramer, female   DOB: 1955/04/02, 67 y.o.   MRN: [id number]  Follow-up: Left ear pain, tinnitus, hearing loss  HPI: The patient is a 67 year old female who returns today for her follow-up evaluation.  She has a history of recurrent left ear pain, tinnitus, and hearing loss.  She was noted to have recurrent left ear shingles.  She was treated with valacyclovir as needed.  The patient returns today complaining of an episode of left ear shingles 6 weeks ago.  She self treated with valacyclovir.  Her left ear pain has mostly resolved.  Currently she denies any otalgia, otorrhea, or significant change in her hearing.  She has a history of bilateral sensorineural hearing loss, worse on the left side.  Exam: General: Communicates without difficulty, well nourished, no acute distress. Head: Normocephalic, no evidence injury, no tenderness, facial buttresses intact without stepoff. Face/sinus: No tenderness to palpation and percussion. Facial movement is normal and symmetric. Eyes: PERRL, EOMI. No scleral icterus, conjunctivae clear. Neuro: CN II exam reveals vision grossly intact.  No nystagmus at any point of gaze. Ears: Auricles well formed without lesions.  Left ear cerumen impaction. Nose: External evaluation reveals normal support and skin without lesions.  Dorsum is intact.  Anterior rhinoscopy reveals pink mucosa over anterior aspect of inferior turbinates and intact septum.  No purulence noted. Oral:  Oral cavity and oropharynx are intact, symmetric, without erythema or edema.  Mucosa is moist without lesions. Neck: Full range of motion without pain.  There is no significant lymphadenopathy.  No masses palpable.  Thyroid bed within normal limits to palpation.  Parotid glands and submandibular glands equal bilaterally without mass.  Trachea is midline. Neuro:  CN 2-12 grossly intact. Gait normal.   Procedure: Left ear cerumen disimpaction Anesthesia: None Description: Under the  operating microscope, the cerumen is carefully removed with a combination of cerumen currette, alligator forceps, and suction catheters.  After the cerumen is removed, the ear canals and TMs are noted to be normal.  No mass, erythema, or lesions. The patient tolerated the procedure well.   Assessment 1.  Left ear cerumen impaction.  After the disimpaction procedure, both tympanic membranes and middle ear spaces are noted to be normal.  2.  Recurrent left ear shingles. No active infection is noted today.  3.  Subjectively stable bilateral sensorineural hearing loss, worse on the left side.  Plan 1.  Otomicroscopy with left ear cerumen disimpaction.  2.  The physical exam findings are reviewed with the patient.   3.  Valacyclovir prn to treat her recurrent shingles.  4.  The patient will return for re-evaluation in 6 months.

## 2023-01-21 DIAGNOSIS — H04123 Dry eye syndrome of bilateral lacrimal glands: Secondary | ICD-10-CM | POA: Diagnosis not present

## 2023-01-21 DIAGNOSIS — H18513 Endothelial corneal dystrophy, bilateral: Secondary | ICD-10-CM | POA: Diagnosis not present

## 2023-01-21 DIAGNOSIS — H2513 Age-related nuclear cataract, bilateral: Secondary | ICD-10-CM | POA: Diagnosis not present

## 2023-01-21 DIAGNOSIS — H40013 Open angle with borderline findings, low risk, bilateral: Secondary | ICD-10-CM | POA: Diagnosis not present

## 2023-02-11 ENCOUNTER — Encounter: Payer: Self-pay | Admitting: Family Medicine

## 2023-02-11 ENCOUNTER — Ambulatory Visit (INDEPENDENT_AMBULATORY_CARE_PROVIDER_SITE_OTHER): Payer: Medicare Other | Admitting: Family Medicine

## 2023-02-11 DIAGNOSIS — J069 Acute upper respiratory infection, unspecified: Secondary | ICD-10-CM | POA: Diagnosis not present

## 2023-02-11 NOTE — Progress Notes (Signed)
Established Patient Office Visit  Subjective   Patient ID: Erica Kramer, female    DOB: 1955-12-31  Age: 68 y.o. MRN: [id number]  Chief Complaint  Patient presents with   Cough    Patient complains of nonproductive cough, x2 weeks, Tried Claritin and Flonase    Nasal Congestion    Patient complains of nasal congestion, x2 weeks     HPI   Erica Kramer is seen with couple weeks history of some cough and nasal congestion.  She had onset of illness around January 2.  She initially had some sore throat which is now better.  Has never had any fever.  Initially had some bodyaches but none now.  This past Friday she noticed some headache and increasing nasal congestion and low bit of pressure left frontal and maxillary sinus region.  She has had some fatigue ever since onset January 2.  No dyspnea.  No purulent secretions.  No upper teeth pain.  Headaches have been not been consistent.  Has been taking over-the-counter Claritin.  Past Medical History:  Diagnosis Date   Allergy    mild   Anemia    as child only   Anxiety    no medicines   Chronic frontal sinusitis 01/28/2007   Qualifier: Diagnosis of  By: Lovell Sheehan MD, John E    COLONIC POLYPS, ADENOMATOUS, HX OF 08/30/2008   Qualifier: Diagnosis of  By: Candice Camp CMA (AAMA), Dottie     Constipation    diet controlled mainly   Diverticulosis    DJD (degenerative joint disease)    GERD 09/02/2008   Qualifier: Diagnosis of  By: Russella Dar MD Bronson Curb T    Glaucoma    Headache(784.0)    HEMATOCHEZIA 09/02/2008   Qualifier: Diagnosis of  By: Russella Dar MD Bronson Curb T    Macular degeneration syndrome    MONILIASIS, SKIN/NAILS 01/28/2007   Qualifier: Diagnosis of  By: Lovell Sheehan MD, John E    Neck pain, chronic    Osteopenia    PONV (postoperative nausea and vomiting)    Rosacea    TMJ (temporomandibular joint disorder)    Past Surgical History:  Procedure Laterality Date   ABDOMINAL HYSTERECTOMY     COLONOSCOPY  04/22/2019    LAPAROSCOPIC APPENDECTOMY  03/12/2011   Procedure: APPENDECTOMY LAPAROSCOPIC;  Surgeon: Robyne Askew, MD;  Location: [facility];  Service: General;  Laterality: N/A;   POLYPECTOMY     TONSILLECTOMY     UPPER GASTROINTESTINAL ENDOSCOPY  04/22/2019    reports that she has never smoked. She has never used smokeless tobacco. She reports current alcohol use. She reports that she does not use drugs. family history includes Barrett's esophagus in her father; Breast cancer in her maternal aunt and mother; Cancer in an other family member; Colon cancer in an other family member; Colon polyps in her father; Diabetes in an other family member; Heart disease in an other family member. No Known Allergies  Review of Systems  Constitutional:  Negative for chills and fever.  HENT:  Positive for congestion.   Respiratory:  Positive for cough. Negative for hemoptysis, shortness of breath and wheezing.       Objective:     There were no vitals taken for this visit.   Physical Exam Vitals reviewed.  Constitutional:      General: She is not in acute distress.    Appearance: She is not ill-appearing.  HENT:     Right Ear: Tympanic membrane and ear canal normal.  Left Ear: Tympanic membrane and ear canal normal.     Mouth/Throat:     Mouth: Mucous membranes are moist.     Pharynx: Oropharynx is clear. No oropharyngeal exudate.  Cardiovascular:     Rate and Rhythm: Normal rate and regular rhythm.  Pulmonary:     Effort: Pulmonary effort is normal.     Breath sounds: Normal breath sounds. No wheezing or rales.  Neurological:     Mental Status: She is alert.      No results found for any visits on 02/11/23.    The ASCVD Risk score (Arnett DK, et al., 2019) failed to calculate for the following reasons:   The systolic blood pressure is missing    Assessment & Plan:   Respiratory symptoms with cough.  Suspect viral.  Question is whether she has second viral illness back-to-back with  first.  She does not have any red flags such as fever, purulent secretions, etc.  We recommend continued conservative management.  Reviewed predictors of bacterial sinusitis.  Be in touch if any of those are noted or if not continuing to improve over the next week  Evelena Peat, MD

## 2023-04-02 ENCOUNTER — Telehealth: Payer: Self-pay

## 2023-04-02 NOTE — Telephone Encounter (Signed)
 Patient called and requested a refill of valacyclovir for there ear.  He filled a prescription back 12/24.  However it expired. Will need a new prescription.  It looks like her PCP writes it as well. Forward message to Timea.

## 2023-04-03 ENCOUNTER — Other Ambulatory Visit (INDEPENDENT_AMBULATORY_CARE_PROVIDER_SITE_OTHER): Payer: Self-pay | Admitting: Otolaryngology

## 2023-04-03 DIAGNOSIS — K080 Exfoliation of teeth due to systemic causes: Secondary | ICD-10-CM | POA: Diagnosis not present

## 2023-04-03 MED ORDER — VALACYCLOVIR HCL 1 G PO TABS: 1000.0000 mg | ORAL_TABLET | Freq: Two times a day (BID) | ORAL | 2 refills | Status: AC

## 2023-06-24 ENCOUNTER — Encounter (INDEPENDENT_AMBULATORY_CARE_PROVIDER_SITE_OTHER): Payer: Self-pay | Admitting: Otolaryngology

## 2023-06-24 ENCOUNTER — Ambulatory Visit (INDEPENDENT_AMBULATORY_CARE_PROVIDER_SITE_OTHER): Payer: Medicare Other | Admitting: Otolaryngology

## 2023-06-24 VITALS — BP 143/91 | HR 76

## 2023-06-24 DIAGNOSIS — H9313 Tinnitus, bilateral: Secondary | ICD-10-CM | POA: Diagnosis not present

## 2023-06-24 DIAGNOSIS — H903 Sensorineural hearing loss, bilateral: Secondary | ICD-10-CM | POA: Diagnosis not present

## 2023-06-24 DIAGNOSIS — B0221 Postherpetic geniculate ganglionitis: Secondary | ICD-10-CM

## 2023-06-24 MED ORDER — VALACYCLOVIR HCL 500 MG PO TABS: 500.0000 mg | ORAL_TABLET | Freq: Two times a day (BID) | ORAL | 3 refills | Status: AC | PRN

## 2023-06-25 NOTE — Progress Notes (Signed)
 Patient ID: Erica Kramer, female   DOB: 1956/01/16, 68 y.o.   MRN: [id number]  Follow-up: Recurrent left ear shingles, bilateral hearing loss, tinnitus  HPI: The patient is a 68 year old female who returns today for her follow-up evaluation.  The patient has a history of recurrent left ear shingles.  She was previously treated with Valtrex .  She also has a history of bilateral sensorineural hearing loss, slightly worse on the left side.  The hearing loss has resulted in recurrent tinnitus.  The patient returns today reporting that she was doing well until 2 weeks ago, she started experiencing symptoms of left ear shingles.  Her symptoms have improved with the use of Valtrex .  Currently she denies any significant otalgia, otorrhea, or change in her hearing.  Exam: General: Communicates without difficulty, well nourished, no acute distress. Head: Normocephalic, no evidence injury, no tenderness, facial buttresses intact without stepoff. Face/sinus: No tenderness to palpation and percussion. Facial movement is normal and symmetric. Eyes: PERRL, EOMI. No scleral icterus, conjunctivae clear. Neuro: CN II exam reveals vision grossly intact.  No nystagmus at any point of gaze. Ears: Auricles well formed without lesions.  The ear canals and tympanic membranes are normal.  Nose: External evaluation reveals normal support and skin without lesions.  Dorsum is intact.  Anterior rhinoscopy reveals pink mucosa over anterior aspect of inferior turbinates and intact septum.  No purulence noted. Oral:  Oral cavity and oropharynx are intact, symmetric, without erythema or edema.  Mucosa is moist without lesions. Neck: Full range of motion without pain.  There is no significant lymphadenopathy.  No masses palpable.  Thyroid  bed within normal limits to palpation.  Parotid glands and submandibular glands equal bilaterally without mass.  Trachea is midline. Neuro:  CN 2-12 grossly intact. Gait normal.   Assessment: 1.   Recurrent likely shingles.  Her recent infection has resolved. 2.  Subjectively stable bilateral sensorineural hearing loss and tinnitus.  Plan: 1.  The physical exam findings are reviewed with the patient. 2.  A refill for Valtrex  is given to the patient. 3.  The patient will return for reevaluation in 6 months, sooner if needed.

## 2023-07-15 DIAGNOSIS — H18513 Endothelial corneal dystrophy, bilateral: Secondary | ICD-10-CM | POA: Diagnosis not present

## 2023-07-15 DIAGNOSIS — H2513 Age-related nuclear cataract, bilateral: Secondary | ICD-10-CM | POA: Diagnosis not present

## 2023-07-15 DIAGNOSIS — H04123 Dry eye syndrome of bilateral lacrimal glands: Secondary | ICD-10-CM | POA: Diagnosis not present

## 2023-07-15 DIAGNOSIS — H40013 Open angle with borderline findings, low risk, bilateral: Secondary | ICD-10-CM | POA: Diagnosis not present

## 2023-07-30 ENCOUNTER — Other Ambulatory Visit: Payer: Self-pay | Admitting: Family Medicine

## 2023-07-30 ENCOUNTER — Encounter: Payer: Self-pay | Admitting: Family Medicine

## 2023-07-30 DIAGNOSIS — Z1231 Encounter for screening mammogram for malignant neoplasm of breast: Secondary | ICD-10-CM

## 2023-08-02 DIAGNOSIS — L720 Epidermal cyst: Secondary | ICD-10-CM | POA: Diagnosis not present

## 2023-08-02 DIAGNOSIS — D2222 Melanocytic nevi of left ear and external auricular canal: Secondary | ICD-10-CM | POA: Diagnosis not present

## 2023-08-02 DIAGNOSIS — L649 Androgenic alopecia, unspecified: Secondary | ICD-10-CM | POA: Diagnosis not present

## 2023-08-02 DIAGNOSIS — L738 Other specified follicular disorders: Secondary | ICD-10-CM | POA: Diagnosis not present

## 2023-08-16 IMAGING — CT CT ABD-PELV W/ CM
2 of 5 series · 16 of 46 positions shown, 18 images · IV contrast (APPLIED)
Comparison: 03/03/2015

CLINICAL DATA: Suprapubic abdominal pain, constipation

EXAM:
CT ABDOMEN AND PELVIS WITH CONTRAST
TECHNIQUE: Multidetector CT imaging of the abdomen and pelvis was performed
using the standard protocol following bolus administration of
intravenous contrast.
CONTRAST:  75mL OMNIPAQUE IOHEXOL 350 MG/ML SOLN

[Series 2: abd pel w · axial · 0.75mm/px · z∈[+725,+1105]mm · 13 of 86 slices shown, 15 images]
[im 5/86  soft-tissue]
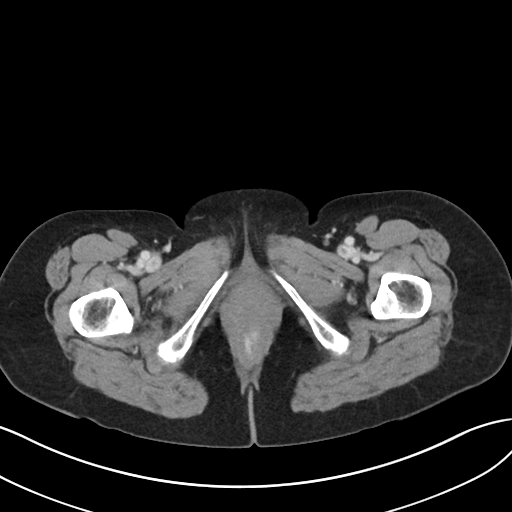
[im 5/86  bone]
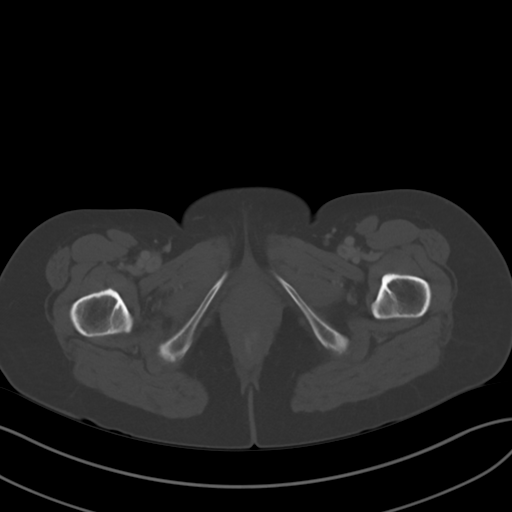
[im 10/86  soft-tissue]
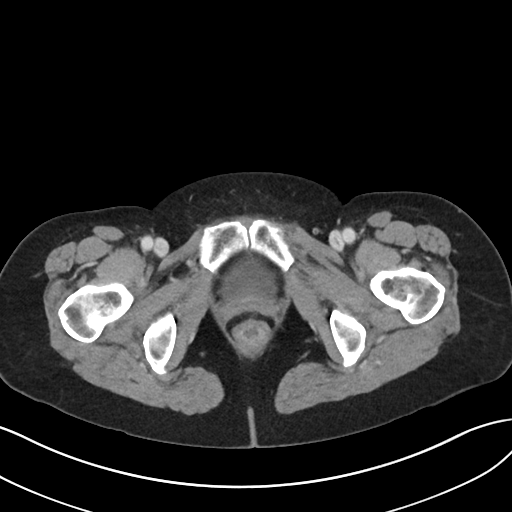
[im 19/86  soft-tissue]
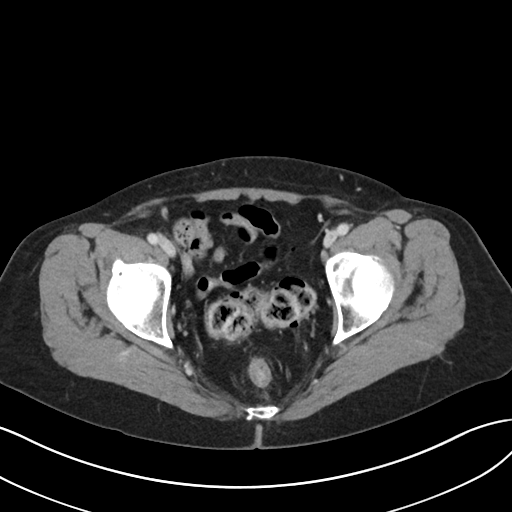
[im 24/86  soft-tissue]
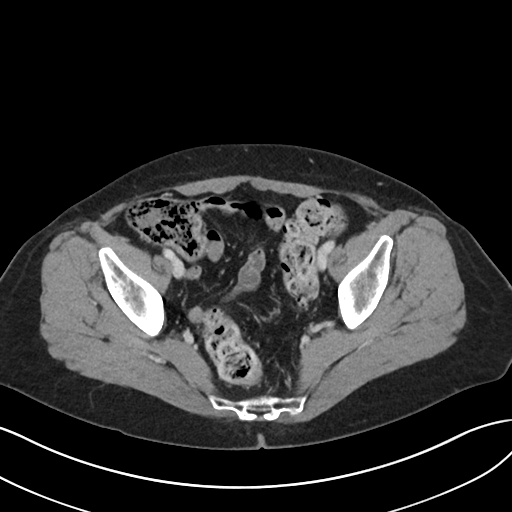
[im 29/86  soft-tissue]
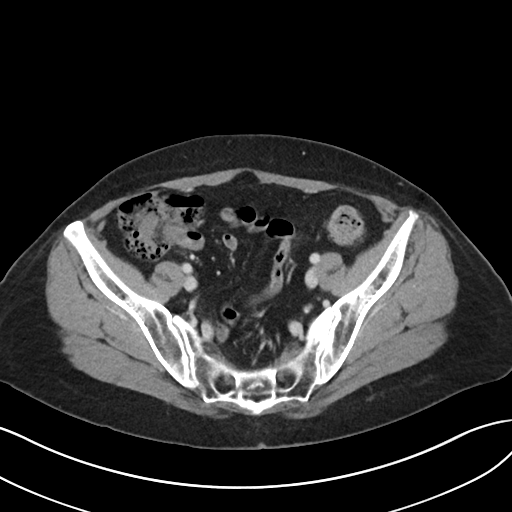
[im 38/86  soft-tissue]
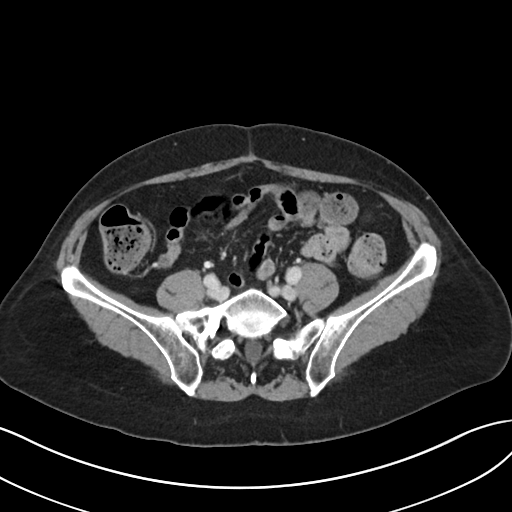
[im 43/86  soft-tissue]
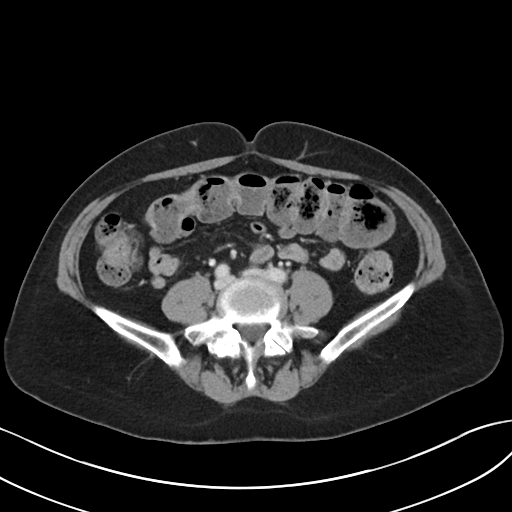
[im 48/86  soft-tissue]
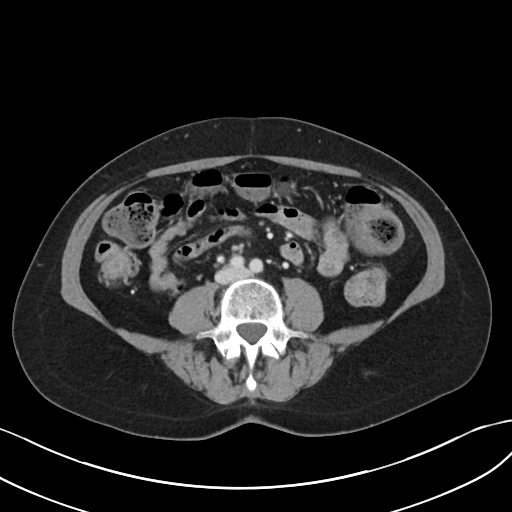
[im 57/86  soft-tissue]
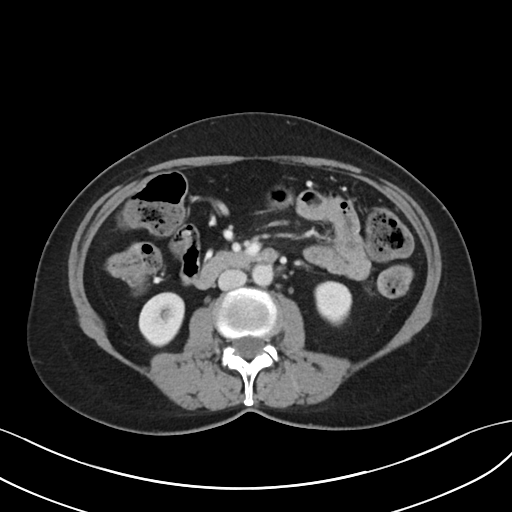
[im 57/86  bone]
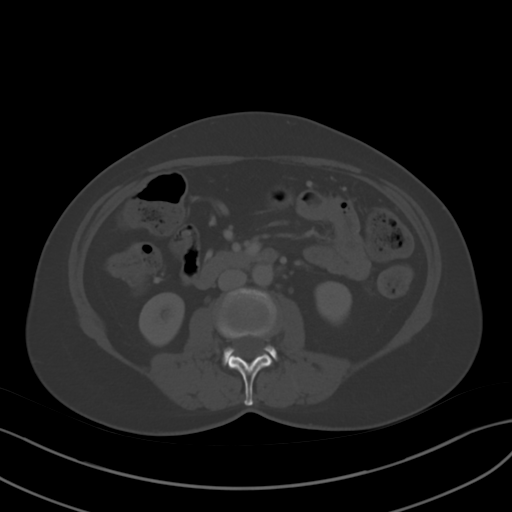
[im 62/86  soft-tissue]
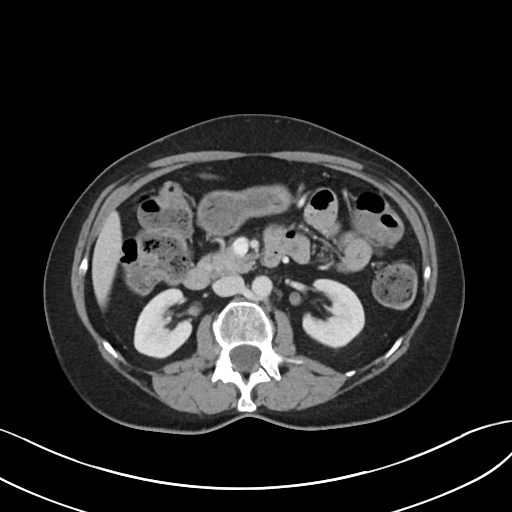
[im 67/86  soft-tissue]
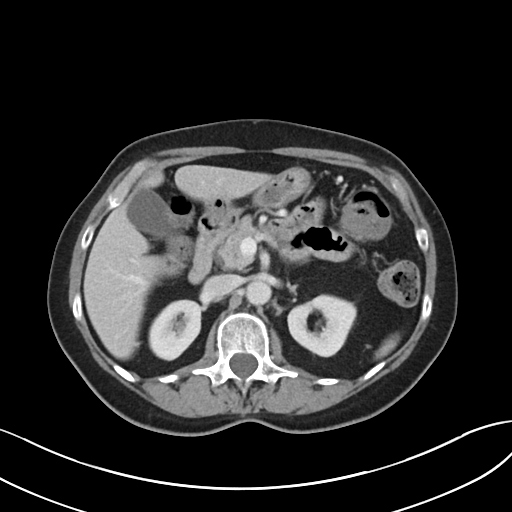
[im 76/86  soft-tissue]
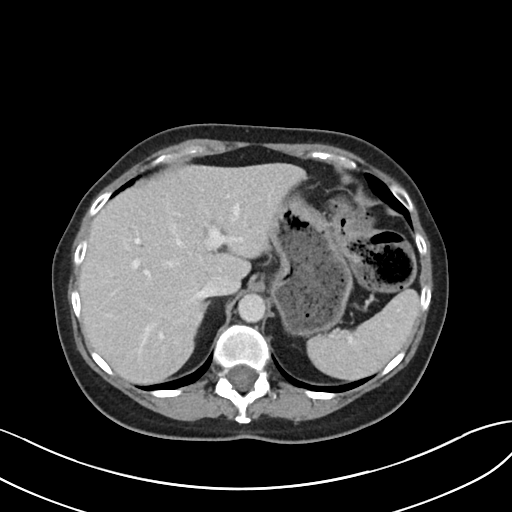
[im 81/86  soft-tissue]
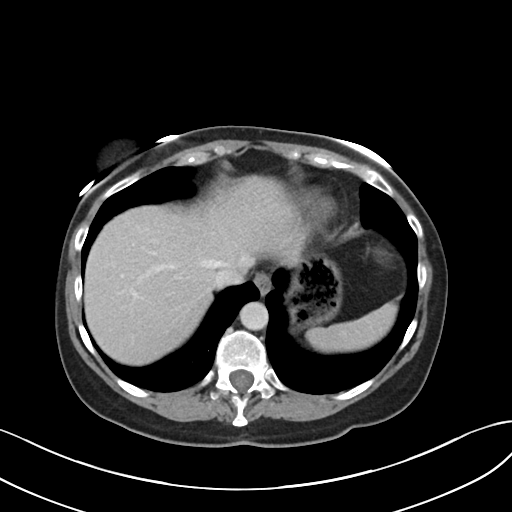

[Series 5: coronal · coronal · 0.78mm/px · 3 of 84 slices shown]
[im 28/84  soft-tissue]
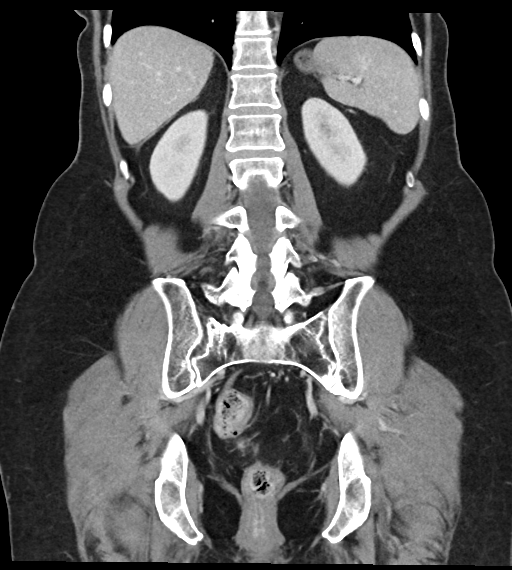
[im 37/84  soft-tissue]
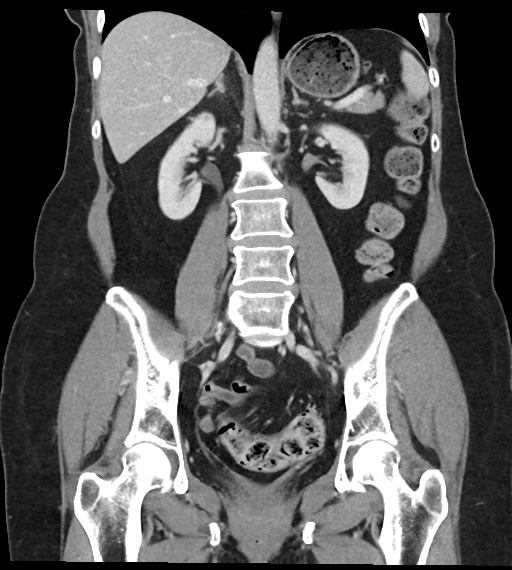
[im 47/84  soft-tissue]
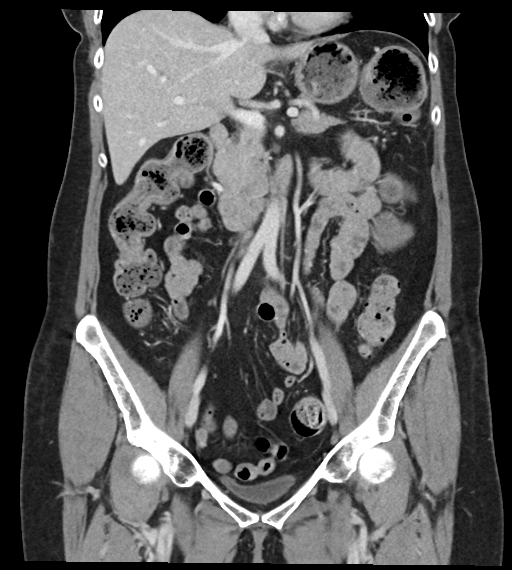

[16 of 46 positions shown; findings below may reference images not displayed]

FINDINGS: Lower chest: Lung bases are clear.

Hepatobiliary: Liver is within normal limits.

Tiny layering gallstones (series 2/image 18), without associated
inflammatory changes. No intrahepatic or extrahepatic ductal
dilatation.

Pancreas: Within normal limits.

Spleen: Within normal limits.

Adrenals/Urinary Tract: Adrenal glands are within normal limits.

Kidneys are within normal limits.  No hydronephrosis.

Bladder is underdistended but unremarkable.

Stomach/Bowel: Stomach is within normal limits.

No evidence of bowel obstruction.

Prior appendectomy.

Very mild sigmoid diverticulosis, without evidence of
diverticulitis.

Mild to moderate left colonic stool burden, suggesting mild
constipation.

Vascular/Lymphatic: No evidence of abdominal aortic aneurysm.

No suspicious abdominopelvic lymphadenopathy.

Reproductive: Status post hysterectomy.

No adnexal masses.

Other: No abdominopelvic ascites.

Musculoskeletal: Visualized osseous structures are within normal
limits.
IMPRESSION: No evidence of bowel obstruction.  Prior appendectomy.

Mild to moderate left colonic stool burden, suggesting mild
constipation.

Tiny layering gallstones, without associated inflammatory changes.

## 2023-09-11 ENCOUNTER — Ambulatory Visit

## 2023-09-18 ENCOUNTER — Ambulatory Visit
Admission: RE | Admit: 2023-09-18 | Discharge: 2023-09-18 | Disposition: A | Discharge status: Home / Self Care | Source: Ambulatory Visit | Attending: Family Medicine | Admitting: Family Medicine

## 2023-09-18 DIAGNOSIS — Z1231 Encounter for screening mammogram for malignant neoplasm of breast: Secondary | ICD-10-CM

## 2023-09-25 ENCOUNTER — Ambulatory Visit

## 2023-09-26 ENCOUNTER — Ambulatory Visit: Payer: Self-pay | Admitting: Family Medicine

## 2023-10-08 DIAGNOSIS — K080 Exfoliation of teeth due to systemic causes: Secondary | ICD-10-CM | POA: Diagnosis not present

## 2023-10-15 ENCOUNTER — Other Ambulatory Visit (HOSPITAL_BASED_OUTPATIENT_CLINIC_OR_DEPARTMENT_OTHER): Payer: Self-pay

## 2023-10-15 MED ORDER — COMIRNATY 30 MCG/0.3ML IM SUSY
0.3000 mL | PREFILLED_SYRINGE | Freq: Once | INTRAMUSCULAR | 0 refills | Status: AC
  Filled 2023-10-15: qty 0.3, 1d supply, fill #0

## 2023-10-21 ENCOUNTER — Encounter: Payer: Self-pay | Admitting: Family Medicine

## 2023-10-21 ENCOUNTER — Ambulatory Visit: Admitting: Family Medicine

## 2023-10-21 VITALS — BP 112/80 | HR 70 | Temp 97.9°F | Ht 64.0 in | Wt 134.9 lb

## 2023-10-21 DIAGNOSIS — Z780 Asymptomatic menopausal state: Secondary | ICD-10-CM | POA: Diagnosis not present

## 2023-10-21 DIAGNOSIS — Z789 Other specified health status: Secondary | ICD-10-CM | POA: Diagnosis not present

## 2023-10-21 DIAGNOSIS — M8580 Other specified disorders of bone density and structure, unspecified site: Secondary | ICD-10-CM

## 2023-10-21 DIAGNOSIS — Z131 Encounter for screening for diabetes mellitus: Secondary | ICD-10-CM | POA: Diagnosis not present

## 2023-10-21 DIAGNOSIS — L659 Nonscarring hair loss, unspecified: Secondary | ICD-10-CM | POA: Diagnosis not present

## 2023-10-21 DIAGNOSIS — Z13220 Encounter for screening for lipoid disorders: Secondary | ICD-10-CM | POA: Diagnosis not present

## 2023-10-21 DIAGNOSIS — Z23 Encounter for immunization: Secondary | ICD-10-CM | POA: Diagnosis not present

## 2023-10-21 DIAGNOSIS — Z0000 Encounter for general adult medical examination without abnormal findings: Secondary | ICD-10-CM | POA: Diagnosis not present

## 2023-10-21 LAB — HEMOGLOBIN A1C: Hgb A1c MFr Bld: 6.1 % (ref 4.6–6.5)

## 2023-10-21 LAB — COMPREHENSIVE METABOLIC PANEL WITH GFR
ALT: 19 U/L (ref 0–35)
AST: 19 U/L (ref 0–37)
Albumin: 4.4 g/dL (ref 3.5–5.2)
Alkaline Phosphatase: 66 U/L (ref 39–117)
BUN: 13 mg/dL (ref 6–23)
CO2: 30 meq/L (ref 19–32)
Calcium: 9.4 mg/dL (ref 8.4–10.5)
Chloride: 103 meq/L (ref 96–112)
Creatinine, Ser: 0.81 mg/dL (ref 0.40–1.20)
GFR: 73.7 mL/min (ref >60.00)
Glucose, Bld: 84 mg/dL (ref 70–99)
Potassium: 4 meq/L (ref 3.5–5.1)
Sodium: 139 meq/L (ref 135–145)
Total Bilirubin: 0.6 mg/dL (ref 0.2–1.2)
Total Protein: 7.2 g/dL (ref 6.0–8.3)

## 2023-10-21 LAB — LIPID PANEL
Cholesterol: 211 mg/dL — ABNORMAL HIGH (ref 0–200)
HDL: 67 mg/dL (ref >39.00)
LDL Cholesterol: 124 mg/dL — ABNORMAL HIGH (ref 0–99)
NonHDL: 144.15
Total CHOL/HDL Ratio: 3
Triglycerides: 101 mg/dL (ref 0.0–149.0)
VLDL: 20.2 mg/dL (ref 0.0–40.0)

## 2023-10-21 LAB — CBC WITH DIFFERENTIAL/PLATELET
Basophils Absolute: 0 K/uL (ref 0.0–0.1)
Basophils Relative: 0.6 % (ref 0.0–3.0)
Eosinophils Absolute: 0.1 K/uL (ref 0.0–0.7)
Eosinophils Relative: 3.3 % (ref 0.0–5.0)
HCT: 42.8 % (ref 36.0–46.0)
Hemoglobin: 14.5 g/dL (ref 12.0–15.0)
Lymphocytes Relative: 32.9 % (ref 12.0–46.0)
Lymphs Abs: 1.2 K/uL (ref 0.7–4.0)
MCHC: 33.8 g/dL (ref 30.0–36.0)
MCV: 95.9 fl (ref 78.0–100.0)
Monocytes Absolute: 0.4 K/uL (ref 0.1–1.0)
Monocytes Relative: 10.3 % (ref 3.0–12.0)
Neutro Abs: 1.9 K/uL (ref 1.4–7.7)
Neutrophils Relative %: 52.9 % (ref 43.0–77.0)
Platelets: 191 K/uL (ref 150.0–400.0)
RBC: 4.46 Mil/uL (ref 3.87–5.11)
RDW: 12.6 % (ref 11.5–15.5)
WBC: 3.6 K/uL — ABNORMAL LOW (ref 4.0–10.5)

## 2023-10-21 LAB — VITAMIN D 25 HYDROXY (VIT D DEFICIENCY, FRACTURES): VITD: 20.09 ng/mL — ABNORMAL LOW (ref 30.00–100.00)

## 2023-10-21 LAB — VITAMIN B12: Vitamin B-12: 163 pg/mL — ABNORMAL LOW (ref 211–911)

## 2023-10-21 LAB — TSH: TSH: 1.8 u[IU]/mL (ref 0.35–5.50)

## 2023-10-21 NOTE — Patient Instructions (Signed)

## 2023-10-21 NOTE — Progress Notes (Unsigned)
 Complete physical exam  Patient: Erica Kramer   DOB: 13-Jun-1955   68 y.o. Female  MRN: [id number]  Subjective:    Chief Complaint  Patient presents with  . Medicare Wellness    Stepheni Laddie Naeem is a 68 y.o. female who presents today for a complete physical exam. She reports consuming a vegetarian diet. Eats eggs and eats dairy. Home exercise routine includes walking 2 hrs per week. She generally feels well. She reports sleeping fairly well. She does not have additional problems to discuss today.    Most recent fall risk assessment:    10/21/2023   10:52 AM  Fall Risk   Falls in the past year? 0  Number falls in past yr: 0  Injury with Fall? 0  Risk for fall due to : No Fall Risks  Follow up Falls evaluation completed     Most recent depression screenings:    10/21/2023   10:52 AM 08/22/2022    1:55 PM  PHQ 2/9 Scores  PHQ - 2 Score 0 0  PHQ- 9 Score 1     Vision:Within last year and Dental: No current dental problems and Receives regular dental care  Patient Active Problem List   Diagnosis Date Noted  . Impacted cerumen of left ear 12/24/2022  . Sensorineural hearing loss, bilateral 12/24/2022  . 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 Glaucoma 06/22/2022  . Hx of colonic polyp 04/10/2019  . Neck pain 01/05/2014  . GERD 09/02/2008      Patient Care Team: Ozell Heron HERO, MD as PCP - General (Family Medicine) Obgyn, Anna Ivin Kocher, MD as Referring Physician (Dermatology)   Outpatient Medications Prior to Visit  Medication Sig  . cholecalciferol (VITAMIN D3) 25 MCG (1000 UNIT) tablet Take 1,000 Units by mouth 3 (three) times a week.  . famotidine  (PEPCID ) 40 MG tablet Take 1 tablet (40 mg total) by mouth 2 (two) times daily. (Patient taking differently: Take 40 mg by mouth daily.)  . fluticasone (FLONASE) 50 MCG/ACT nasal spray Place 1 spray into both nostrils daily as needed for allergies.  . influenza vac split quadrivalent PF  (FLUARIX) 0.5 ML injection Inject into the muscle.  . influenza vaccine adjuvanted (FLUAD ) 0.5 ML injection Inject into the muscle.  . latanoprost (XALATAN) 0.005 % ophthalmic solution Place 1 drop into both eyes at bedtime.    SABRA loratadine (CLARITIN) 10 MG tablet Take 10 mg by mouth daily as needed for allergies.  SABRA PRESCRIPTION MEDICATION Estradiol vaginal cream 0.02%-1-2 times weekly  . valACYclovir  (VALTREX ) 500 MG tablet Take 1 tablet (500 mg total) by mouth 2 (two) times daily as needed (shingles).  . [DISCONTINUED] calcium-vitamin D (OSCAL WITH D) 500-200 MG-UNIT per tablet Take 1 tablet by mouth daily. Takes on and off  . [DISCONTINUED] valACYclovir  (VALTREX ) 500 MG tablet Take 1 tablet (500 mg total) by mouth daily. (Patient taking differently: Take 500 mg by mouth as needed.)   No facility-administered medications prior to visit.    Review of Systems  HENT:  Negative for hearing loss.   Eyes:  Negative for blurred vision.  Respiratory:  Negative for shortness of breath.   Cardiovascular:  Negative for chest pain.  Gastrointestinal: Negative.   Genitourinary: Negative.   Musculoskeletal:  Negative for back pain.  Neurological:  Negative for headaches.  Psychiatric/Behavioral:  Negative for depression.        Objective:     BP 112/80   Pulse 70   Temp 97.9 F (  36.6 C) (Oral)   Ht 5' 4 (1.626 m)   Wt 134 lb 14.4 oz (61.2 kg)   SpO2 98%   BMI 23.16 kg/m  {Vitals History (Optional):23777}  Physical Exam Vitals reviewed.  Constitutional:      Appearance: Normal appearance. She is well-groomed and normal weight.  HENT:     Right Ear: Tympanic membrane and ear canal normal.     Left Ear: Tympanic membrane and ear canal normal.     Mouth/Throat:     Mouth: Mucous membranes are moist.     Pharynx: No posterior oropharyngeal erythema.  Eyes:     Conjunctiva/sclera: Conjunctivae normal.  Neck:     Thyroid : No thyromegaly.  Cardiovascular:     Rate and Rhythm:  Normal rate and regular rhythm.     Pulses: Normal pulses.     Heart sounds: S1 normal and S2 normal.  Pulmonary:     Effort: Pulmonary effort is normal.     Breath sounds: Normal breath sounds and air entry.  Abdominal:     General: Abdomen is flat. Bowel sounds are normal.     Palpations: Abdomen is soft.  Musculoskeletal:     Right lower leg: No edema.     Left lower leg: No edema.  Lymphadenopathy:     Cervical: No cervical adenopathy.  Neurological:     Mental Status: She is alert and oriented to person, place, and time. Mental status is at baseline.     Gait: Gait is intact.  Psychiatric:        Mood and Affect: Mood and affect normal.        Speech: Speech normal.        Behavior: Behavior normal.        Judgment: Judgment normal.      No results found for any visits on 10/21/23. {Show previous labs (optional):23779}    Assessment & Plan:    Routine Health Maintenance and Physical Exam  Immunization History  Administered Date(s) Administered  . Fluad  Quad(high Dose 65+) 10/29/2018, 11/22/2020  . Fluad  Trivalent(High Dose 65+) 10/10/2022  . Influenza Nasal 10/30/2010  . Influenza Split 11/13/2011  . Influenza Whole 11/25/2008  . Influenza,inj,Quad PF,6+ Mos 09/22/2012, 10/27/2013, 11/07/2015, 10/12/2016, 11/07/2017  . Influenza-Unspecified 11/08/2014  . PFIZER(Purple Top)SARS-COV-2 Vaccination 03/14/2019, 04/07/2019, 11/17/2019, 05/12/2020  . PNEUMOCOCCAL CONJUGATE-20 06/27/2022  . Pfizer Covid-19 Vaccine Bivalent Booster 47yrs & up 11/14/2020  . Pfizer(Comirnaty )Fall Seasonal Vaccine 12 years and older 11/13/2021, 10/03/2022, 10/15/2023  . Pneumococcal Polysaccharide-23 10/29/2018  . Td 11/30/2008  . Tdap 12/05/2022  . Zoster Recombinant(Shingrix ) 11/14/2017  . Zoster, Live 10/19/2013    Health Maintenance  Topic Date Due  . Zoster Vaccines- Shingrix  (2 of 2) 01/09/2018  . Medicare Annual Wellness (AWV)  08/22/2023  . Influenza Vaccine  08/23/2023  .  Mammogram  09/17/2025  . Colonoscopy  08/17/2027  . DTaP/Tdap/Td (3 - Td or Tdap) 12/04/2032  . Pneumococcal Vaccine: 50+ Years  Completed  . DEXA SCAN  Completed  . COVID-19 Vaccine  Completed  . Hepatitis C Screening  Completed  . HPV VACCINES  Aged Out  . Meningococcal B Vaccine  Aged Out    Discussed health benefits of physical activity, and encouraged her to engage in regular exercise appropriate for her age and condition.  Lipid screening -     Lipid panel; Future  Osteopenia after menopause -     VITAMIN D 25 Hydroxy (Vit-D Deficiency, Fractures); Future  Hair loss -  TSH; Future -     CBC with Differential/Platelet; Future -     Comprehensive metabolic panel with GFR; Future  Vegetarian -     Vitamin B12; Future  Diabetes mellitus screening -     Hemoglobin A1c; Future  Routine general medical examination at a health care facility  Other orders -     Flu vaccine HIGH DOSE PF(Fluzone Trivalent)  General physical exam findings are normal today. I reviewed the patient's preventative testing, immunizations, and lifestyle habits. I made appropriate recommendations and placed orders for the appropriate tests and/or vaccinations. I counseled the patient on the CDC's recommendations for healthy exercise and diet. I counseled the patient on healthy sleep habits and stress management. Handouts to reinforce the counseling were given at the conclusion of the visit.    Return in 1 year (on 10/20/2024).     Heron CHRISTELLA Sharper, MD

## 2023-10-22 ENCOUNTER — Ambulatory Visit: Payer: Self-pay | Admitting: Family Medicine

## 2023-10-23 ENCOUNTER — Ambulatory Visit

## 2023-10-23 VITALS — Ht 64.0 in | Wt 134.0 lb

## 2023-10-23 DIAGNOSIS — Z0000 Encounter for general adult medical examination without abnormal findings: Secondary | ICD-10-CM

## 2023-10-23 NOTE — Progress Notes (Signed)
 Subjective:   Erica Kramer is a 68 y.o. who presents for a Medicare Wellness preventive visit.  As a reminder, Annual Wellness Visits don't include a physical exam, and some assessments may be limited, especially if this visit is performed virtually. We may recommend an in-person follow-up visit with your provider if needed.  Visit Complete: Virtual I connected with  Erica Kramer on 10/23/23 by a audio enabled telemedicine application and verified that I am speaking with the correct person using two identifiers.  Patient Location: Home  Provider Location: Home Office  I discussed the limitations of evaluation and management by telemedicine. The patient expressed understanding and agreed to proceed.  Vital Signs: Because this visit was a virtual/telehealth visit, some criteria may be missing or patient reported. Any vitals not documented were not able to be obtained and vitals that have been documented are patient reported.    Persons Participating in Visit: Patient.  AWV Questionnaire: Yes: Patient Medicare AWV questionnaire was completed by the patient on 10/22/23; I have confirmed that all information answered by patient is correct and no changes since this date.  Cardiac Risk Factors include: advanced age (>65men, >65 women)     Objective:    Today's Vitals   10/23/23 1019  Weight: 134 lb (60.8 kg)  Height: 5' 4 (1.626 m)   Body mass index is 23 kg/m.     10/23/2023   10:23 AM 08/22/2022    2:01 PM 10/07/2020   12:59 AM 05/18/2016    1:44 PM 03/12/2011   10:10 PM 03/12/2011    7:09 PM  Advanced Directives  Does Patient Have a Medical Advance Directive? Yes Yes No Yes  Patient does not have advance directive  Patient has advance directive, copy not in chart   Type of Advance Directive Healthcare Power of [place];Living will Healthcare Power of [place];Living will  Healthcare Power of [place];Living will  Healthcare Power of [place];Living will   Copy of  Healthcare Power of Attorney in Chart? No - copy requested No - copy requested      Would patient like information on creating a medical advance directive?   No - Patient declined     Pre-existing out of facility DNR order (yellow form or pink MOST form)     No       Data saved with a previous flowsheet row definition    Current Medications (verified) Outpatient Encounter Medications as of 10/23/2023  Medication Sig   cholecalciferol (VITAMIN D3) 25 MCG (1000 UNIT) tablet Take 1,000 Units by mouth 3 (three) times a week.   famotidine  (PEPCID ) 40 MG tablet Take 1 tablet (40 mg total) by mouth 2 (two) times daily. (Patient taking differently: Take 40 mg by mouth daily.)   fluticasone (FLONASE) 50 MCG/ACT nasal spray Place 1 spray into both nostrils daily as needed for allergies.   influenza vac split quadrivalent PF (FLUARIX) 0.5 ML injection Inject into the muscle.   influenza vaccine adjuvanted (FLUAD ) 0.5 ML injection Inject into the muscle.   latanoprost (XALATAN) 0.005 % ophthalmic solution Place 1 drop into both eyes at bedtime.     loratadine (CLARITIN) 10 MG tablet Take 10 mg by mouth daily as needed for allergies.   PRESCRIPTION MEDICATION Estradiol vaginal cream 0.02%-1-2 times weekly   valACYclovir  (VALTREX ) 500 MG tablet Take 1 tablet (500 mg total) by mouth 2 (two) times daily as needed (shingles).   No facility-administered encounter medications on file as of 10/23/2023.    Allergies (  verified) Patient has no known allergies.   History: Past Medical History:  Diagnosis Date   Allergy    mild   Anemia    as child only   Anxiety    no medicines   Chronic frontal sinusitis 01/28/2007   Qualifier: Diagnosis of  By: Mavis MD, John E    COLONIC POLYPS, ADENOMATOUS, HX OF 08/30/2008   Qualifier: Diagnosis of  By: Marcelo CMA (AAMA), Dottie     Constipation    diet controlled mainly   Diverticulosis    DJD (degenerative joint disease)    GERD 09/02/2008    Qualifier: Diagnosis of  By: Aneita MD NOLIA Gist T    Glaucoma    Headache(784.0)    HEMATOCHEZIA 09/02/2008   Qualifier: Diagnosis of  By: Aneita MD NOLIA Gist T    Macular degeneration syndrome    MONILIASIS, SKIN/NAILS 01/28/2007   Qualifier: Diagnosis of  By: Mavis MD, John E    Neck pain, chronic    Osteopenia    PONV (postoperative nausea and vomiting)    Rosacea    TMJ (temporomandibular joint disorder)    Past Surgical History:  Procedure Laterality Date   ABDOMINAL HYSTERECTOMY     COLONOSCOPY  04/22/2019   LAPAROSCOPIC APPENDECTOMY  03/12/2011   Procedure: APPENDECTOMY LAPAROSCOPIC;  Surgeon: Deward GORMAN Curvin DOUGLAS, MD;  Location: [facility];  Service: General;  Laterality: N/A;   POLYPECTOMY     TONSILLECTOMY     UPPER GASTROINTESTINAL ENDOSCOPY  04/22/2019   Family History  Problem Relation Age of Onset   Breast cancer Mother    Colon polyps Father    Barrett's esophagus Father    Heart disease Other    Cancer Other    Colon cancer Other    Diabetes Other    Breast cancer Maternal Aunt    Rectal cancer Neg Hx    Stomach cancer Neg Hx    Esophageal cancer Neg Hx    Social History   Socioeconomic History   Marital status: Married    Spouse name: Not on file   Number of children: Not on file   Years of education: Not on file   Highest education level: Bachelor's degree (e.g., BA, AB, BS)  Occupational History   Not on file  Tobacco Use   Smoking status: Never   Smokeless tobacco: Never  Vaping Use   Vaping status: Never Used  Substance and Sexual Activity   Alcohol use: Not Currently    Comment: occasional wine-2 to 3 glasses per year   Drug use: No   Sexual activity: Yes  Other Topics Concern   Not on file  Social History Narrative   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, 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 [employer] Situation: lives with husband - kids are 88 and 16      Spiritual Beliefs: Christian and Media planner       Lifestyle: not regular exercise 2 times per week; diet is ok      Social Drivers of Corporate investment banker Strain: Low Risk  (10/23/2023)   Overall Financial Resource Strain (CARDIA)    Difficulty of Paying Living Expenses: Not hard at all  Food Insecurity: No Food Insecurity (10/23/2023)   Hunger Vital Sign    Worried About Running Out of Food in the Last Year: Never true    Ran Out of Food in the Last Year: Never true  Transportation Needs: No Transportation  Needs (10/23/2023)   PRAPARE - Administrator, Civil Service (Medical): No    Lack of Transportation (Non-Medical): No  Physical Activity: Sufficiently Active (10/23/2023)   Exercise Vital Sign    Days of Exercise per Week: 6 days    Minutes of Exercise per Session: 40 min  Stress: Stress Concern Present (10/23/2023)   [employer] of Occupational Health - Occupational Stress Questionnaire    Feeling of Stress: To some extent  Social Connections: Socially Integrated (10/23/2023)   Social Connection and Isolation Panel    Frequency of Communication with Friends and Family: More than three times a week    Frequency of Social Gatherings with Friends and Family: More than three times a week    Attends Religious Services: More than 4 times per year    Active Member of [employer] or Organizations: Yes    Attends Engineer, structural: More than 4 times per year    Marital Status: Married    Tobacco Counseling Counseling given: Not Answered    Clinical Intake:  Pre-visit preparation completed: Yes  Pain : No/denies pain     BMI - recorded: 23 Nutritional Status: BMI of 19-24  Normal Nutritional Risks: None Diabetes: No  Lab Results  Component Value Date   HGBA1C 6.1 10/21/2023   HGBA1C 5.7 09/19/2020   HGBA1C 5.8 10/29/2018     How often do you need to have someone help you when you read instructions, pamphlets, or other written materials from your doctor or pharmacy?: 1 -  Never  Interpreter Needed?: No  Information entered by :: Rojelio Blush LPN   Activities of Daily Living     10/23/2023   10:22 AM 10/22/2023   12:54 PM  In your present state of health, do you have any difficulty performing the following activities:  Hearing? 0 0  Vision? 0 0  Difficulty concentrating or making decisions? 0 0  Walking or climbing stairs? 0 0  Dressing or bathing? 0 0  Doing errands, shopping? 0 0  Preparing Food and eating ? N N  Using the Toilet? N N  In the past six months, have you accidently leaked urine? N N  Do you have problems with loss of bowel control? N N  Managing your Medications? N N  Managing your Finances? N   Housekeeping or managing your Housekeeping? N N    Patient Care Team: Ozell Heron HERO, MD as PCP - General (Family Medicine) Obgyn, Anna Ivin Kocher, MD as Referring Physician (Dermatology)  I have updated your Care Teams any recent Medical Services you may have received from other providers in the past year.     Assessment:   This is a routine wellness examination for Erica Kramer.  Hearing/Vision screen Hearing Screening - Comments:: Denies hearing difficulties   Vision Screening - Comments:: Wears rx glasses - up to date [facility] with  [facility]   Goals Addressed               This Visit's Progress     Continue physical activity (pt-stated)        Stay active       Depression Screen     10/23/2023   10:27 AM 10/21/2023   10:52 AM 08/22/2022    1:55 PM 06/22/2022   10:58 AM 09/16/2020    2:19 PM 10/29/2018    8:57 AM 11/14/2017    1:04 PM  PHQ 2/9 Scores  PHQ - 2 Score  0 0 0 0 0 0 0  PHQ- 9 Score 0 1   1      Fall Risk     10/23/2023   10:22 AM 10/22/2023   12:54 PM 10/21/2023   10:52 AM 08/22/2022    1:58 PM 08/22/2022   10:47 AM  Fall Risk   Falls in the past year? 0 0 0 0 0  Number falls in past yr: 0  0 0   Injury with Fall? 0  0 0   Risk for fall due to : No Fall Risks  No  Fall Risks No Fall Risks   Follow up Falls evaluation completed  Falls evaluation completed Falls prevention discussed     MEDICARE RISK AT HOME:  Medicare Risk at Home Any stairs in or around the home?: Yes If so, are there any without handrails?: No Home free of loose throw rugs in walkways, pet beds, electrical cords, etc?: Yes Adequate lighting in your home to reduce risk of falls?: Yes Life alert?: No Use of a cane, walker or w/c?: No Grab bars in the bathroom?: No Shower chair or bench in shower?: No Elevated toilet seat or a handicapped toilet?: No  TIMED UP AND GO:  Was the test performed?  No  Cognitive Function: 6CIT completed        10/23/2023   10:24 AM 08/22/2022    2:01 PM  6CIT Screen  What Year? 0 points 0 points  What month? 0 points 0 points  What time? 0 points 0 points  Count back from 20 0 points 0 points  Months in reverse 0 points 0 points  Repeat phrase 0 points 0 points  Total Score 0 points 0 points    Immunizations Immunization History  Administered Date(s) Administered   Fluad  Quad(high Dose 65+) 10/29/2018, 11/22/2020   Fluad  Trivalent(High Dose 65+) 10/10/2022   INFLUENZA, HIGH DOSE SEASONAL PF 10/21/2023   Influenza Nasal 10/30/2010   Influenza Split 11/13/2011   Influenza Whole 11/25/2008   Influenza,inj,Quad PF,6+ Mos 09/22/2012, 10/27/2013, 11/07/2015, 10/12/2016, 11/07/2017   Influenza-Unspecified 11/08/2014   PFIZER(Purple Top)SARS-COV-2 Vaccination 03/14/2019, 04/07/2019, 11/17/2019, 05/12/2020   PNEUMOCOCCAL CONJUGATE-20 06/27/2022   Pfizer Covid-19 Vaccine Bivalent Booster 65yrs & up 11/14/2020   Pfizer(Comirnaty )Fall Seasonal Vaccine 12 years and older 11/13/2021, 10/03/2022, 10/15/2023   Pneumococcal Polysaccharide-23 10/29/2018   Td 11/30/2008   Tdap 12/05/2022   Zoster Recombinant(Shingrix ) 11/14/2017   Zoster, Live 10/19/2013    Screening Tests Health Maintenance  Topic Date Due   Zoster Vaccines- Shingrix  (2 of  2) 01/09/2018   Medicare Annual Wellness (AWV)  10/22/2024   Mammogram  09/17/2025   Colonoscopy  08/17/2027   DTaP/Tdap/Td (3 - Td or Tdap) 12/04/2032   Pneumococcal Vaccine: 50+ Years  Completed   Influenza Vaccine  Completed   DEXA SCAN  Completed   COVID-19 Vaccine  Completed   Hepatitis C Screening  Completed   HPV VACCINES  Aged Out   Meningococcal B Vaccine  Aged Out    Health Maintenance Items Addressed:   Additional Screening:  Vision Screening: Recommended annual ophthalmology exams for early detection of glaucoma and other disorders of the eye. Is the patient up to date with their annual eye exam?  Yes  Who is the provider or what is the name of the office in which the patient attends annual eye exams? [facility]  Dental Screening: Recommended annual dental exams for proper oral hygiene  Community Resource Referral / Chronic Care Management: CRR  required this visit?  No   CCM required this visit?  No   Plan:    I have personally reviewed and noted the following in the patient's chart:   Medical and social history Use of alcohol, tobacco or illicit drugs  Current medications and supplements including opioid prescriptions. Patient is not currently taking opioid prescriptions. Functional ability and status Nutritional status Physical activity Advanced directives List of other physicians Hospitalizations, surgeries, and ER visits in previous 12 months Vitals Screenings to include cognitive, depression, and falls Referrals and appointments  In addition, I have reviewed and discussed with patient certain preventive protocols, quality metrics, and best practice recommendations. A written personalized care plan for preventive services as well as general preventive health recommendations were provided to patient.   Rojelio LELON Blush, LPN   [id number]/08/[id number]   After Visit Summary: (MyChart) Due to this being a telephonic visit, the after visit summary with patients  personalized plan was offered to patient via MyChart   Notes: Nothing significant to report at this time.

## 2023-10-23 NOTE — Patient Instructions (Addendum)
 Erica Kramer,  Thank you for taking the time for your Medicare Wellness Visit. I appreciate your continued commitment to your health goals. Please review the care plan we discussed, and feel free to reach out if I can assist you further.  Medicare recommends these wellness visits once per year to help you and your care team stay ahead of potential health issues. These visits are designed to focus on prevention, allowing your provider to concentrate on managing your acute and chronic conditions during your regular appointments.  Please note that Annual Wellness Visits do not include a physical exam. Some assessments may be limited, especially if the visit was conducted virtually. If needed, we may recommend a separate in-person follow-up with your provider.  Ongoing Care Seeing your primary care provider every 3 to 6 months helps us  monitor your health and provide consistent, personalized care.   Referrals If a referral was made during today's visit and you haven't received any updates within two weeks, please contact the referred provider directly to check on the status.  Recommended Screenings:  Health Maintenance  Topic Date Due   Zoster (Shingles) Vaccine (2 of 2) 01/09/2018   Medicare Annual Wellness Visit  10/22/2024   Breast Cancer Screening  09/17/2025   Colon Cancer Screening  08/17/2027   DTaP/Tdap/Td vaccine (3 - Td or Tdap) 12/04/2032   Pneumococcal Vaccine for age over 76  Completed   Flu Shot  Completed   DEXA scan (bone density measurement)  Completed   COVID-19 Vaccine  Completed   Hepatitis C Screening  Completed   HPV Vaccine  Aged Out   Meningitis B Vaccine  Aged Out       10/23/2023   10:23 AM  Advanced Directives  Does Patient Have a Medical Advance Directive? Yes  Type of Estate agent of [place];Living will  Copy of Healthcare Power of Attorney in Chart? No - copy requested   Advance Care Planning is important because it: Ensures you  receive medical care that aligns with your values, goals, and preferences. Provides guidance to your family and loved ones, reducing the emotional burden of decision-making during critical moments.  Vision: Annual vision screenings are recommended for early detection of glaucoma, cataracts, and diabetic retinopathy. These exams can also reveal signs of chronic conditions such as diabetes and high blood pressure.  Dental: Annual dental screenings help detect early signs of oral cancer, gum disease, and other conditions linked to overall health, including heart disease and diabetes.  Please see the attached documents for additional preventive care recommendations.

## 2023-11-21 DIAGNOSIS — Z124 Encounter for screening for malignant neoplasm of cervix: Secondary | ICD-10-CM | POA: Diagnosis not present

## 2023-11-21 DIAGNOSIS — N952 Postmenopausal atrophic vaginitis: Secondary | ICD-10-CM | POA: Diagnosis not present

## 2023-11-21 DIAGNOSIS — Z1331 Encounter for screening for depression: Secondary | ICD-10-CM | POA: Diagnosis not present

## 2023-12-30 ENCOUNTER — Encounter (INDEPENDENT_AMBULATORY_CARE_PROVIDER_SITE_OTHER): Payer: Self-pay | Admitting: Otolaryngology

## 2023-12-30 ENCOUNTER — Ambulatory Visit (INDEPENDENT_AMBULATORY_CARE_PROVIDER_SITE_OTHER): Admitting: Otolaryngology

## 2023-12-30 VITALS — BP 120/78 | Temp 98.2°F | Ht 64.5 in | Wt 135.0 lb

## 2023-12-30 DIAGNOSIS — B0221 Postherpetic geniculate ganglionitis: Secondary | ICD-10-CM

## 2023-12-30 DIAGNOSIS — H903 Sensorineural hearing loss, bilateral: Secondary | ICD-10-CM

## 2023-12-30 DIAGNOSIS — H9313 Tinnitus, bilateral: Secondary | ICD-10-CM | POA: Insufficient documentation

## 2023-12-30 NOTE — Progress Notes (Signed)
 Patient ID: Erica Kramer, female   DOB: 02-10-1955, 69 y.o.   MRN: [id number]  Follow up: Recurrent left ear shingles, bilateral hearing loss, tinnitus   Discussed the use of AI scribe software for clinical note transcription with the patient, who gave verbal consent to proceed.  History of Present Illness Erica Kramer is a 68 year old female who presents today for her follow-up evaluation.  She has a history of recurrent left ear shingles, bilateral hearing loss, and bilateral tinnitus.  She was treated with Valtrex  as needed.  She has experienced fewer episodes of shingles since her last visit.  The use of Valtrex  resolved the symptoms in about three days. Her symptoms typically begin with itching, followed by soreness deep in the left ear, and then a headache on the left side. The pain can radiate down the side of her face, jaw, and sometimes into the neck. Valacyclovir  quickly alleviates these symptoms.  She experiences mild symptoms occasionally, which sometimes resolve with rest without the need for medication. She attributes stress and sleep deprivation, particularly when caring for her elderly mother, as triggers for her shingles episodes. She also notes that even a small amount of alcohol can trigger symptoms, so she avoids it.  She reports improvement in her hearing issues and ringing, which primarily occurs in the left ear.  Exam: General: Communicates without difficulty, well nourished, no acute distress. Head: Normocephalic, no evidence injury, no tenderness, facial buttresses intact without stepoff. Face/sinus: No tenderness to palpation and percussion. Facial movement is normal and symmetric. Eyes: PERRL, EOMI. No scleral icterus, conjunctivae clear. Neuro: CN II exam reveals vision grossly intact.  No nystagmus at any point of gaze. Ears: Auricles well formed without lesions.  Ear canals are intact without mass or lesion.  No erythema or edema is appreciated.  The TMs are intact  without fluid. Nose: External evaluation reveals normal support and skin without lesions.  Dorsum is intact.  Anterior rhinoscopy reveals congested mucosa over anterior aspect of inferior turbinates and intact septum.  No purulence noted. Oral:  Oral cavity and oropharynx are intact, symmetric, without erythema or edema.  Mucosa is moist without lesions. Neck: Full range of motion without pain.  There is no significant lymphadenopathy.  No masses palpable.  Thyroid  bed within normal limits to palpation.  Parotid glands and submandibular glands equal bilaterally without mass.  Trachea is midline. Neuro:  CN 2-12 grossly intact.    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 Symptoms are exacerbated by stress and sleep deprivation. Valacyclovir  is effective in managing symptoms when taken at the onset of symptoms. She has not received the second shingles vaccine due to concerns about potential symptom exacerbation. - Renewed prescription for valacyclovir  with refills. - Advised taking valacyclovir  at the onset of symptoms if bothersome. - Encouraged stress reduction and adequate sleep to prevent recurrence.  Bilateral sensorineural hearing loss and tinnitus Improvement in tinnitus and hearing issues.  - Continue to monitor hearing and tinnitus symptoms.

## 2024-01-06 DIAGNOSIS — H2513 Age-related nuclear cataract, bilateral: Secondary | ICD-10-CM | POA: Diagnosis not present

## 2024-01-06 DIAGNOSIS — H04123 Dry eye syndrome of bilateral lacrimal glands: Secondary | ICD-10-CM | POA: Diagnosis not present

## 2024-01-06 DIAGNOSIS — H40013 Open angle with borderline findings, low risk, bilateral: Secondary | ICD-10-CM | POA: Diagnosis not present

## 2024-01-06 DIAGNOSIS — H18513 Endothelial corneal dystrophy, bilateral: Secondary | ICD-10-CM | POA: Diagnosis not present

## 2024-10-22 ENCOUNTER — Encounter: Admitting: Family Medicine

## 2024-10-28 ENCOUNTER — Ambulatory Visit
